# Patient Record
Sex: Female | Born: 1985 | Race: White | Hispanic: No | Marital: Married | Smoking: Current every day smoker
Health system: Southern US, Community
[De-identification: ages and names within clinical notes are randomized; demographics above are authoritative.]

## PROBLEM LIST (undated history)

## (undated) ENCOUNTER — Inpatient Hospital Stay (HOSPITAL_COMMUNITY): Payer: Self-pay

## (undated) DIAGNOSIS — F319 Bipolar disorder, unspecified: Secondary | ICD-10-CM

## (undated) DIAGNOSIS — F329 Major depressive disorder, single episode, unspecified: Secondary | ICD-10-CM

## (undated) DIAGNOSIS — F32A Depression, unspecified: Secondary | ICD-10-CM

## (undated) DIAGNOSIS — Z789 Other specified health status: Secondary | ICD-10-CM

## (undated) DIAGNOSIS — F419 Anxiety disorder, unspecified: Secondary | ICD-10-CM

## (undated) HISTORY — DX: Bipolar disorder, unspecified: F31.9

## (undated) HISTORY — PX: WISDOM TOOTH EXTRACTION: SHX21

---

## 2012-07-29 ENCOUNTER — Emergency Department (HOSPITAL_COMMUNITY): Payer: No Typology Code available for payment source

## 2012-07-29 ENCOUNTER — Encounter (HOSPITAL_COMMUNITY): Payer: Self-pay | Admitting: Emergency Medicine

## 2012-07-29 ENCOUNTER — Emergency Department (HOSPITAL_COMMUNITY)
Admission: EM | Admit: 2012-07-29 | Discharge: 2012-07-29 | Disposition: A | Discharge status: Home / Self Care | Payer: No Typology Code available for payment source | Attending: Emergency Medicine | Admitting: Emergency Medicine

## 2012-07-29 DIAGNOSIS — S139XXA Sprain of joints and ligaments of unspecified parts of neck, initial encounter: Secondary | ICD-10-CM | POA: Insufficient documentation

## 2012-07-29 DIAGNOSIS — F17200 Nicotine dependence, unspecified, uncomplicated: Secondary | ICD-10-CM | POA: Insufficient documentation

## 2012-07-29 DIAGNOSIS — Y92410 Unspecified street and highway as the place of occurrence of the external cause: Secondary | ICD-10-CM | POA: Insufficient documentation

## 2012-07-29 LAB — URINALYSIS, ROUTINE W REFLEX MICROSCOPIC
Bilirubin Urine: NEGATIVE
Ketones, ur: NEGATIVE mg/dL
Nitrite: NEGATIVE
Protein, ur: NEGATIVE mg/dL
Urobilinogen, UA: 0.2 mg/dL (ref 0.0–1.0)

## 2012-07-29 LAB — URINE MICROSCOPIC-ADD ON

## 2012-07-29 MED ORDER — IBUPROFEN 800 MG PO TABS
800.0000 mg | ORAL_TABLET | Freq: Once | ORAL | Status: AC
  Administered 2012-07-29: 800 mg via ORAL
  Filled 2012-07-29: qty 1

## 2012-07-29 MED ORDER — CYCLOBENZAPRINE HCL 10 MG PO TABS: 10.0000 mg | ORAL_TABLET | Freq: Two times a day (BID) | ORAL | Status: AC | PRN

## 2012-07-29 MED ORDER — CYCLOBENZAPRINE HCL 10 MG PO TABS
10.0000 mg | ORAL_TABLET | Freq: Once | ORAL | Status: AC
  Administered 2012-07-29: 10 mg via ORAL
  Filled 2012-07-29: qty 1

## 2012-07-29 MED ORDER — IBUPROFEN 800 MG PO TABS: 800.0000 mg | ORAL_TABLET | Freq: Three times a day (TID) | ORAL | Status: AC
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
## 2012-07-30 NOTE — ED Provider Notes (Signed)
Medical screening examination/treatment/procedure(s) were performed by non-physician practitioner and as supervising physician I was immediately available for consultation/collaboration.  Jahmiyah Dullea, MD 07/30/12 1520 

## 2012-08-05 ENCOUNTER — Other Ambulatory Visit (HOSPITAL_COMMUNITY): Payer: Self-pay | Admitting: Chiropractic Medicine

## 2012-08-05 ENCOUNTER — Ambulatory Visit (HOSPITAL_COMMUNITY)
Admission: RE | Admit: 2012-08-05 | Discharge: 2012-08-05 | Disposition: A | Discharge status: Home / Self Care | Payer: No Typology Code available for payment source | Source: Ambulatory Visit | Attending: Chiropractic Medicine | Admitting: Chiropractic Medicine

## 2012-08-05 DIAGNOSIS — M545 Low back pain, unspecified: Secondary | ICD-10-CM | POA: Insufficient documentation

## 2012-08-05 DIAGNOSIS — M549 Dorsalgia, unspecified: Secondary | ICD-10-CM

## 2012-08-05 DIAGNOSIS — Q7649 Other congenital malformations of spine, not associated with scoliosis: Secondary | ICD-10-CM | POA: Insufficient documentation

## 2013-12-11 NOTE — L&D Delivery Note (Signed)
Delivery Note At 4:53 PM a viable and healthy female was delivered via Vaginal, Spontaneous Delivery (Presentation: OA;  ).  APGAR: 8 ;8;  weight pending Placenta status: Intact, Spontaneous.  Cord: 3 vessels with the following complications: None.  Cord pH: not sent   Anesthesia: Epidural  Episiotomy: None Lacerations: 2nd degree;Perineal Suture Repair: 3.0 vicryl rapide Est. Blood Loss (mL): 300 cc  Mom to postpartum.  Baby to nursery for observation due to retracting and nasal flaring but stable.   Maressa Apollo R 07/09/2014, 5:29 PM

## 2014-04-22 ENCOUNTER — Inpatient Hospital Stay (HOSPITAL_COMMUNITY)
Admission: AD | Admit: 2014-04-22 | Discharge: 2014-04-25 | DRG: 782 | Disposition: A | Discharge status: Home / Self Care | Payer: BC Managed Care – PPO | Source: Ambulatory Visit | Attending: Obstetrics and Gynecology | Admitting: Obstetrics and Gynecology

## 2014-04-22 ENCOUNTER — Encounter (HOSPITAL_COMMUNITY): Payer: Self-pay

## 2014-04-22 ENCOUNTER — Inpatient Hospital Stay (HOSPITAL_COMMUNITY): Payer: BC Managed Care – PPO

## 2014-04-22 DIAGNOSIS — Z87891 Personal history of nicotine dependence: Secondary | ICD-10-CM

## 2014-04-22 DIAGNOSIS — O4410 Placenta previa with hemorrhage, unspecified trimester: Principal | ICD-10-CM | POA: Diagnosis present

## 2014-04-22 DIAGNOSIS — O4693 Antepartum hemorrhage, unspecified, third trimester: Secondary | ICD-10-CM | POA: Diagnosis present

## 2014-04-22 HISTORY — DX: Other specified health status: Z78.9

## 2014-04-22 LAB — CBC
HCT: 34.2 % — ABNORMAL LOW (ref 36.0–46.0)
Hemoglobin: 11.9 g/dL — ABNORMAL LOW (ref 12.0–15.0)
MCH: 30.6 pg (ref 26.0–34.0)
MCHC: 34.8 g/dL (ref 30.0–36.0)
MCV: 87.9 fL (ref 78.0–100.0)
Platelets: 256 10*3/uL (ref 150–400)
RBC: 3.89 MIL/uL (ref 3.87–5.11)
RDW: 13.5 % (ref 11.5–15.5)
WBC: 9.7 10*3/uL (ref 4.0–10.5)

## 2014-04-22 LAB — TYPE AND SCREEN
ABO/RH(D): A POS
Antibody Screen: NEGATIVE

## 2014-04-22 MED ORDER — SODIUM CHLORIDE 0.9 % IJ SOLN
3.0000 mL | INTRAMUSCULAR | Status: DC | PRN
  Administered 2014-04-25: 3 mL via INTRAVENOUS

## 2014-04-22 MED ORDER — CALCIUM CARBONATE ANTACID 500 MG PO CHEW
2.0000 | CHEWABLE_TABLET | ORAL | Status: DC | PRN
  Filled 2014-04-22: qty 2

## 2014-04-22 MED ORDER — SODIUM CHLORIDE 0.9 % IJ SOLN
3.0000 mL | Freq: Two times a day (BID) | INTRAMUSCULAR | Status: DC
  Administered 2014-04-23 – 2014-04-24 (×5): 3 mL via INTRAVENOUS

## 2014-04-22 MED ORDER — PRENATAL MULTIVITAMIN CH
1.0000 | ORAL_TABLET | Freq: Every day | ORAL | Status: DC
  Administered 2014-04-23 – 2014-04-25 (×3): 1 via ORAL
  Filled 2014-04-22 (×3): qty 1

## 2014-04-22 MED ORDER — ACETAMINOPHEN 325 MG PO TABS: 650.0000 mg | ORAL_TABLET | ORAL | Status: DC | PRN

## 2014-04-22 MED ORDER — DOCUSATE SODIUM 100 MG PO CAPS
100.0000 mg | ORAL_CAPSULE | Freq: Every day | ORAL | Status: DC
  Filled 2014-04-22 (×3): qty 1

## 2014-04-22 MED ORDER — ZOLPIDEM TARTRATE 5 MG PO TABS: 5.0000 mg | ORAL_TABLET | Freq: Every evening | ORAL | Status: DC | PRN

## 2014-04-22 MED ORDER — SODIUM CHLORIDE 0.9 % IV SOLN: 250.0000 mL | INTRAVENOUS | Status: DC | PRN

## 2014-04-22 MED ORDER — BETAMETHASONE SOD PHOS & ACET 6 (3-3) MG/ML IJ SUSP
12.0000 mg | INTRAMUSCULAR | Status: AC
  Administered 2014-04-22 – 2014-04-23 (×2): 12 mg via INTRAMUSCULAR
  Filled 2014-04-22 (×2): qty 2

## 2014-04-22 MED ORDER — FOLIC ACID 1 MG PO TABS
1.0000 mg | ORAL_TABLET | Freq: Every day | ORAL | Status: DC
  Administered 2014-04-23 – 2014-04-25 (×3): 1 mg via ORAL
  Filled 2014-04-22 (×4): qty 1

## 2014-04-22 MED ORDER — FOLIC ACID 800 MCG PO TABS: 800.0000 ug | ORAL_TABLET | Freq: Every day | ORAL | Status: DC

## 2014-04-22 NOTE — Progress Notes (Signed)
Notified of pt arrival in MAU and complaint. Will come see pt 

## 2014-04-22 NOTE — MAU Note (Signed)
Called antenatal to give report to charge RN. Charge RN in room and will call back

## 2014-04-22 NOTE — MAU Note (Addendum)
Pt presents of heavy bright red bleeding that started around 1920. Denies vaginal discharge. Reports good fetal movement. Pt has known complete previa per prenatal record

## 2014-04-22 NOTE — MAU Note (Signed)
Report given to charge RN on antenatal. Will go to room 156

## 2014-04-22 NOTE — MAU Provider Note (Signed)
  History   CSN: [id number]  Arrival date and time: 04/22/14 1944 Nurse call to provider @ 2011 Provider here to evaluate patient @ 2018   Chief Complaint  Patient presents with  . Vaginal Bleeding    known complete previa   [facility] Ob patient of Dr Ernestina PennaFogleman Onset bright red bleeding this evening after getting home from work Last intercourse was Sunday - no bleeding or spotting Hx previa @ [facility] - follow-up sono scheduled for May 26th  Medical History reviewed: oligiomenorrhea Surgical history reviewed: none Family HX reviewed: sister's first child-anencephalic  Prenatal records reviewed: Blood type/ RH: A+ Complete previa without any previous bleeding episode (+) carpal tunnel of pregnancy  History  Substance Use Topics  . Smoking status: Never Smoker   . Smokeless tobacco: Not on file  . Alcohol Use: No   Allergies:  Allergies  Allergen Reactions  . Latex Itching   Current medications: Prenatal vitamin daily  ROS active FM no ctx or cramping + red bleeding since 1900  Physical Exam   Blood pressure 116/84, pulse 92, temperature 97.7 F (36.5 C), temperature source Oral, resp. rate 18.  Physical Exam Alert and oriented x 3 NAD or pain Abdomen soft and non-tender / uterus gravid and non-tender Spec exam: small dark red blood in vaginal vault                      cervix appears long and closed - no active bleeding seen  FHR baseline 150 / moderate variability / + accels to 180 / no decels TOCO - no ctx or UI  MAU Course  Procedures  NST - reactive  Ultrasound -    Assessment and Plan  26.4 weeks primigravida / Rh (+) Bright red vaginal bleeding episode  Hx placenta previa FHR category 1 without evidence of ctx  1) ultrasound  2) NST and toco 3) consult MD for admit after sono  Marlinda Mikeanya Adric Wrede CNM MSN [facility] 04/22/2014, 8:24 PM   ADDENDUM NOTE 2155pm - CONSULT with Dr Billy Coastaavon - updated with status and sono report  Plan:    admit 24-48 hours s/p third trimester bleeding episode likely marginal bleed BMZ course EFM toco for uterine activity / intermittent FHR assessment  Marlinda Mikeanya Takera Rayl CNM MSN [facility]

## 2014-04-22 NOTE — H&P (Signed)
  OB ADMISSION/ HISTORY & PHYSICAL:  Admission Date: 04/22/2014  7:45 PM  Admit Diagnosis: 26.4 weeks with third trimester bleeding - presumed marginal placental bleed. Previous history of placenta previa.  Anne Rodriguez is a 28 y.o. female presenting for bright red bleeding episode this evening - hx previa.  Prenatal History: G1P0   EDC : 07/25/2014, Alternate EDD Entry  [facility]  Primary Ob Provider: Dr Ernestina PennaFogleman Prenatal course complicated by previa  Prenatal Labs: ABO, Rh:   A positive  Antibody:  negative Rubella:   Immune RPR:   NR HBsAg:   negative HIV:   NR GTT: not done yet GBS:   not done yet  Medical / Surgical History :  Past medical history:oligiomenorrhea  Past surgical history: History reviewed. No pertinent past surgical history.  Family History: sister's child anecephaly  Social History:  reports that she has never smoked. She does not have any smokeless tobacco history on file. She reports that she does not drink alcohol or use illicit drugs.  Allergies: Latex   Current Medications at time of admission:  Prior to Admission medications   Medication Sig Start Date End Date Taking? Authorizing Provider  Ca Carbonate-Mag Hydroxide (ROLAIDS PO) Take 4 tablets by mouth daily.   Yes Historical Provider, MD  folic acid (FOLVITE) 800 MCG tablet Take 800 mcg by mouth daily.   Yes Historical Provider, MD  Prenatal Vit-Fe Fumarate-FA (PRENATAL MULTIVITAMIN) TABS tablet Take 1 tablet by mouth daily at 12 noon.   Yes Historical Provider, MD   Physical Exam:  SONO per MFM - low lying placenta - no previa / cervical length 3.5  VS: Blood pressure 116/84, pulse 92, temperature 97.7 F (36.5 C), temperature source Oral, resp. rate 18.  General: alert and oriented, appears calm and NAD or pain Heart: RRR Lungs: Clear lung fields Abdomen: Gravid, soft and non-tender, non-distended / uterus: gravid and non-tender Extremities: no  edema  Genitalia / VE:  DEFERRED  (cervical length per sono 3.5cm) / appears closed/long on visual exam  FHR: baseline rate 150 / variability moderate / accelerations + / no decelerations TOCO: no ctx or UI  Assessment: 26.[redacted] weeks gestation Third trimester bleeding - marginal placental bleed. Low lying post placenta on sono. FHR category 1 No evidence of PTL or persistent hemorrhage necessitating delivery at this time  Plan:  Admit for 24-48 hours to monitor bleeding resolution BMZ course TOCO to monitor for CTX  Discuss with patient and spouse plan of care & rationale for inpt management OOW until next ROB NO sex until placenta no longer low lying  Marlinda Mikeanya Bailey CNM, MSN, [facility] 04/22/2014, 9:59 PM

## 2014-04-23 LAB — GLUCOSE, CAPILLARY: Glucose-Capillary: 116 mg/dL — ABNORMAL HIGH (ref 70–99)

## 2014-04-23 LAB — ABO/RH: ABO/RH(D): A POS

## 2014-04-23 NOTE — Progress Notes (Signed)
Patient ID: Peggye LeyMarcia Rodriguez, female   DOB: [birth year], 28 y.o.   MRN: [id number] HD #1 26 5/7 weeks Low lying placenta with bleeding  S: Good FM Spotting today. No further heavy bleeding. No LOF. No CP or SOB. O: BP 118/60  Pulse 86  Temp(Src) 97.8 F (36.6 C) (Oral)  Resp 18  Ht 5\' 1"  (1.549 m)  Wt 80.74 kg (178 lb)  BMI 33.65 kg/m2  Lgs: CTA CV: RRR Abd: soft , gravid , non tender No CVAT Pelvic : deferred Ext: Neg c/c/e Neuro: non focal Skin: intact  FHR reactive. No contractions  CBC    Component Value Date/Time   WBC 9.7 04/22/2014 2235   RBC 3.89 04/22/2014 2235   HGB 11.9* 04/22/2014 2235   HCT 34.2* 04/22/2014 2235   PLT 256 04/22/2014 2235   MCV 87.9 04/22/2014 2235   MCH 30.6 04/22/2014 2235   MCHC 34.8 04/22/2014 2235   RDW 13.5 04/22/2014 2235    IMP: 26 5/7 weeks IUP Posterior Low lying placenta with acute bleeding episode Reformed Smoker  P: BMZ #1 done. Repeat today. Type and Screen NST q shift

## 2014-04-24 NOTE — Progress Notes (Addendum)
HD #2 26 6/7 weeks  Low lying placenta with bleeding  S:  Good FM  Spotting today. No further heavy bleeding.  No LOF. No CP or SOB.  O:  BP 115/54  Pulse 106   Temp(Src) 98.3 F (Oral)  Resp 18  Ht 5\' 1"  (1.549 m)  Wt 80.74 kg (178 lb)  BMI 33.65 kg/m2  Lgs: CTA  CV: RRR  Abd: soft , gravid , non tender  No CVAT  Pelvic : deferred  Ext: Neg c/c/e  Neuro: non focal  Skin: intact  FHR reactive.  No contractions  CBC    Component  Value  Date/Time    WBC  9.7  04/22/2014 2235    RBC  3.89  04/22/2014 2235    HGB  11.9*  04/22/2014 2235    HCT  34.2*  04/22/2014 2235    PLT  256  04/22/2014 2235    MCV  87.9  04/22/2014 2235    MCH  30.6  04/22/2014 2235    MCHC  34.8  04/22/2014 2235    RDW  13.5  04/22/2014 2235    IMP:  26 6/7 weeks IUP.  FHR monitoring reactive. Posterior Low lying placenta with acute bleeding episode  Reformed Smoker  P:  BMZ x2 completed.  Will keep in hospital x at least 24 hrs without vaginal bleeding. Type and Screen  NST q shift  Genia DelMarie-Lyne Iyana Topor MD  04/24/14 at 11:01

## 2014-04-24 NOTE — MAU Provider Note (Signed)
Reviewed and agree with note and plan. V.Dyanara Cozza, MD  

## 2014-04-25 NOTE — Progress Notes (Signed)
HD #3 27 weeks  Low lying placenta with bleeding  S:  Good FM  Brown Spotting today. No further red bleeding, none for almost 48 hrs  No LOF. No CP or SOB.  No ctx O:  BP 115/54  Pulse 106   Temp(Src) 98.3 F (Oral)  Resp 18  Ht 5\' 1"  (1.549 m)  Wt 80.74 kg (178 lb)  BMI 33.65 kg/m2   Abd: soft , gravid , non tender  No CVAT  Pelvic : scant brown d/c  Ext: Neg c/c/e  Neuro: non focal  Skin: intact   NST pending   CBC    Component  Value  Date/Time    WBC  9.7  04/22/2014 2235    RBC  3.89  04/22/2014 2235    HGB  11.9*  04/22/2014 2235    HCT  34.2*  04/22/2014 2235    PLT  256  04/22/2014 2235    MCV  87.9  04/22/2014 2235    MCH  30.6  04/22/2014 2235    MCHC  34.8  04/22/2014 2235    RDW  13.5  04/22/2014 2235    IMP:  27 weeks IUP.   Posterior Low lying placenta with acute bleeding episode, now resolved Reformed Smoker  P:  BMZ x2 completed.   D/c to home on bedrest w/ f/u in 1.5 wks for repeat u/s Return immediately w/ any bleeding  Wilmon Conover A. Pauline Pegues 04/25/2014 11:13 AM

## 2014-04-25 NOTE — Discharge Summary (Signed)
Patient ID: Anne Rodriguez MRN: [id number] DOB/AGE: 1986/10/29 27 y.o.  Admit date: 04/22/2014 Discharge date: 04/25/14  Admission Diagnoses: 2nd trimester bleeding, low lying placenta Discharge Diagnoses:  2nd trimester bleeding, low lying placenta        Discharged Condition: stable  Hospital Course: admitted with bright red vaginal bleeding. Cervix long/ closed, uterus non-tender, reactive NST, no evidence labor.  NST/ toco continued. BMZ received, bed rest. U/s done to show low lying placenta. Pt remained stable on bed rest. HD#2 additional single episode of bleeding. Reactive maternal and fetal status, mom hemodynamically stable. D/c to home on HD#4 with instructions for bed rest at home.   Consults: None and MFM  Treatments: IV hydration, steroids: BMZ  Disposition: home     Medication List    STOP taking these medications       ROLAIDS PO      TAKE these medications       folic acid 800 MCG tablet  Commonly known as:  FOLVITE  Take 800 mcg by mouth daily.     prenatal multivitamin Tabs tablet  Take 1 tablet by mouth daily at 12 noon.         Signed: Alphonsus SiasKelly A. Ernestina PennaFogleman, MD MD 04/25/2014, 11:15 AM

## 2014-04-29 ENCOUNTER — Inpatient Hospital Stay (HOSPITAL_COMMUNITY): Payer: BC Managed Care – PPO

## 2014-04-29 ENCOUNTER — Inpatient Hospital Stay (HOSPITAL_COMMUNITY)
Admission: AD | Admit: 2014-04-29 | Discharge: 2014-04-29 | Disposition: A | Discharge status: Home / Self Care | Payer: BC Managed Care – PPO | Source: Ambulatory Visit | Attending: Obstetrics & Gynecology | Admitting: Obstetrics & Gynecology

## 2014-04-29 ENCOUNTER — Encounter (HOSPITAL_COMMUNITY): Payer: Self-pay

## 2014-04-29 DIAGNOSIS — R109 Unspecified abdominal pain: Secondary | ICD-10-CM | POA: Insufficient documentation

## 2014-04-29 DIAGNOSIS — O4440 Low lying placenta NOS or without hemorrhage, unspecified trimester: Secondary | ICD-10-CM

## 2014-04-29 DIAGNOSIS — O4410 Placenta previa with hemorrhage, unspecified trimester: Secondary | ICD-10-CM | POA: Insufficient documentation

## 2014-04-29 DIAGNOSIS — Z87891 Personal history of nicotine dependence: Secondary | ICD-10-CM | POA: Insufficient documentation

## 2014-04-29 DIAGNOSIS — O4693 Antepartum hemorrhage, unspecified, third trimester: Secondary | ICD-10-CM

## 2014-04-29 LAB — URINE MICROSCOPIC-ADD ON

## 2014-04-29 LAB — URINALYSIS, ROUTINE W REFLEX MICROSCOPIC
Bilirubin Urine: NEGATIVE
Glucose, UA: NEGATIVE mg/dL
Ketones, ur: NEGATIVE mg/dL
Leukocytes, UA: NEGATIVE
Nitrite: NEGATIVE
Protein, ur: NEGATIVE mg/dL
SPECIFIC GRAVITY, URINE: 1.01 (ref 1.005–1.030)
Urobilinogen, UA: 1 mg/dL (ref 0.0–1.0)
pH: 6 (ref 5.0–8.0)

## 2014-04-29 NOTE — Discharge Instructions (Signed)
Vaginal Bleeding During Pregnancy, Third Trimester A small amount of bleeding (spotting) from the vagina is relatively common in pregnancy. Various things can cause bleeding or spotting in pregnancy. Sometimes the bleeding is normal and is not a problem. However, bleeding during the third trimester can also be a sign of something serious for the mother and the baby. Be sure to tell your health care provider about any vaginal bleeding right away.  Some possible causes of vaginal bleeding during the third trimester include:   The placenta may be partially or completely covering the opening to the cervix (placenta previa).   The placenta may have separated from the uterus (abruption of the placenta).   There may be an infection or growth on the cervix.   You may be starting labor, called discharging of the mucus plug.   The placenta may grow into the muscle layer of the uterus (placenta accreta).  HOME CARE INSTRUCTIONS  Watch your condition for any changes. The following actions may help to lessen any discomfort you are feeling:   Follow your health care provider's instructions for limiting your activity. If your health care provider orders bed rest, you may need to stay in bed and only get up to use the bathroom. However, your health care provider may allow you to continue light activity.  If needed, make plans for someone to help with your regular activities and responsibilities while you are on bed rest.  Keep track of the number of pads you use each day, how often you change pads, and how soaked (saturated) they are. Write this down.  Do not use tampons. Do not douche.  Do not have sexual intercourse or orgasms until approved by your health care provider.  Follow your health care provider's advice about lifting, driving, and physical activities.  If you pass any tissue from your vagina, save the tissue so you can show it to your health care provider.   Only take over-the-counter  or prescription medicines as directed by your health care provider.  Do not take aspirin because it can make you bleed.   Keep all follow-up appointments as directed by your health care provider. SEEK MEDICAL CARE IF:  You have any vaginal bleeding during any part of your pregnancy.  You have cramps or labor pains. SEEK IMMEDIATE MEDICAL CARE IF:   You have severe cramps or pain in your back or belly (abdomen).  You have a fever, not controlled by medicine.  You have chills.  You have a gush of fluid from the vagina.  You pass large clots or tissue from your vagina.  Your bleeding increases.  You feel lightheaded or weak.  You pass out.  You feel less movement or no movement of the baby.  MAKE SURE YOU:  Understand these instructions.  Will watch your condition.  Will get help right away if you are not doing well or get worse. Document Released: 02/17/2003 Document Revised: 09/17/2013 Document Reviewed: 08/04/2013 Exit[employer] Patient Information 2014 [employer].  

## 2014-04-29 NOTE — MAU Provider Note (Signed)
History     CSN: [id number]  Arrival date and time: 04/29/14 0404 RN notified provider of pt arrival 0426 First Provider Initiated Contact with Patient 04/29/14 [phone]      Chief Complaint  Patient presents with  . Vaginal Bleeding   HPI Comments: G1 @27 .4 wks with acute episode of BRB @0300  this am. Soaked through pad and clothes, passed dime sized clot x2. Good FM. No LOF. Mild cramping lower left. Pregnancy previously complicated by previa, most recently posterior and low-lying, 1.2 cm from os with hospital admission on 04/22/14 for episode of bleeding. S/p BMZ x2. On BR at home since discharge. Nothing PV. Blood type A pos.  Vaginal Bleeding The patient's primary symptoms include vaginal bleeding. This is a recurrent problem. The current episode started today.    OB History   Grav Para Term Preterm Abortions TAB SAB Ect Mult Living   1 0 0 0 0 0 0 0 0 0       Past Medical History  Diagnosis Date  . Medical history non-contributory     Past Surgical History  Procedure Laterality Date  . Wisdom tooth extraction      Family History  Problem Relation Age of Onset  . Cancer Mother   . Heart disease Father     History  Substance Use Topics  . Smoking status: Former Smoker -- 0.50 packs/day for 6 years    Types: Cigarettes    Quit date: 11/10/2012  . Smokeless tobacco: Not on file  . Alcohol Use: No    Allergies:  Allergies  Allergen Reactions  . Latex Itching    Prescriptions prior to admission  Medication Sig Dispense Refill  . folic acid (FOLVITE) 800 MCG tablet Take 800 mcg by mouth daily.      . Prenatal Vit-Fe Fumarate-FA (PRENATAL MULTIVITAMIN) TABS tablet Take 1 tablet by mouth daily at 12 noon.        Review of Systems  Constitutional: Negative.   HENT: Negative.   Eyes: Negative.   Respiratory: Negative.   Cardiovascular: Negative.   Gastrointestinal: Negative.   Genitourinary: Positive for vaginal bleeding.       +VB +cramping LLQ   Musculoskeletal: Negative.   Skin: Negative.   Neurological: Negative.   Endo/Heme/Allergies: Negative.   Psychiatric/Behavioral: Negative.    Physical Exam   Blood pressure 123/75, pulse 99, temperature 97.8 F (36.6 C), temperature source Oral, resp. rate 18, height 5\' 1"  (1.549 m), weight 79.379 kg (175 lb), SpO2 99.00%.  Physical Exam  Constitutional: She is oriented to person, place, and time. She appears well-developed and well-nourished.  HENT:  Head: Normocephalic.  Neck: Normal range of motion.  Cardiovascular: Normal rate and regular rhythm.   Respiratory: Effort normal and breath sounds normal.  GI: Soft. Bowel sounds are normal.  gravid  Genitourinary: Vagina normal.  Speculum: small dark red blood in vault, no active bleeding from os, small clot x1, visually closed  Musculoskeletal: Normal range of motion.  Neurological: She is alert and oriented to person, place, and time.  Skin: Skin is warm and dry.  Psychiatric: She has a normal mood and affect.  EFM: 140 bpm, moderate variability, +accels, no decels Toco: none  MAU Course  Procedures  Sono (preliminary) No placental abruption. Portions of posterior placenta are obscured by fetal parts. Placenta posterior, above os. Inferior placental edge is 1.52 cm from internal os. AFV: subjectively normal. Vertex presentation. CL 4.4 cm.   Assessment and Plan  27.[redacted] weeks gestation Posterior  low-lying placenta with acute bleeding episode, no evidence of abruption Fetal tracing Cat I  Discharge home BR and pelvic rest Bleeding precautions Follow-up this week in office with Dr. Ernestina PennaFogleman  Discussed assessment and plan with Dr. Dairl PonderLavoie  Rome Schlauch N Sergio Zawislak 04/29/2014, 5:49 AM

## 2014-04-29 NOTE — MAU Note (Signed)
Pt reports she had another episode of vaginal bleeding this am, mild cramping

## 2014-05-26 ENCOUNTER — Inpatient Hospital Stay (HOSPITAL_COMMUNITY)
Admission: AD | Admit: 2014-05-26 | Discharge: 2014-05-31 | DRG: 782 | Disposition: A | Discharge status: Home / Self Care | Payer: BC Managed Care – PPO | Source: Ambulatory Visit | Attending: Obstetrics | Admitting: Obstetrics

## 2014-05-26 ENCOUNTER — Encounter (HOSPITAL_COMMUNITY): Payer: Self-pay | Admitting: *Deleted

## 2014-05-26 DIAGNOSIS — O4590 Premature separation of placenta, unspecified, unspecified trimester: Principal | ICD-10-CM | POA: Diagnosis present

## 2014-05-26 DIAGNOSIS — O4700 False labor before 37 completed weeks of gestation, unspecified trimester: Secondary | ICD-10-CM | POA: Diagnosis present

## 2014-05-26 DIAGNOSIS — O4593 Premature separation of placenta, unspecified, third trimester: Secondary | ICD-10-CM | POA: Diagnosis present

## 2014-05-26 DIAGNOSIS — Z87891 Personal history of nicotine dependence: Secondary | ICD-10-CM

## 2014-05-26 LAB — TYPE AND SCREEN
ABO/RH(D): A POS
Antibody Screen: NEGATIVE

## 2014-05-26 LAB — CBC
HCT: 36.6 % (ref 36.0–46.0)
Hemoglobin: 12.5 g/dL (ref 12.0–15.0)
MCH: 30.5 pg (ref 26.0–34.0)
MCHC: 34.2 g/dL (ref 30.0–36.0)
MCV: 89.3 fL (ref 78.0–100.0)
PLATELETS: 247 10*3/uL (ref 150–400)
RBC: 4.1 MIL/uL (ref 3.87–5.11)
RDW: 14 % (ref 11.5–15.5)
WBC: 10.1 10*3/uL (ref 4.0–10.5)

## 2014-05-26 MED ORDER — PRENATAL MULTIVITAMIN CH
1.0000 | ORAL_TABLET | Freq: Every day | ORAL | Status: DC
  Administered 2014-05-27 – 2014-05-31 (×5): 1 via ORAL
  Filled 2014-05-26 (×5): qty 1

## 2014-05-26 MED ORDER — SODIUM CHLORIDE 0.9 % IJ SOLN: 3.0000 mL | INTRAMUSCULAR | Status: DC | PRN

## 2014-05-26 MED ORDER — SODIUM CHLORIDE 0.9 % IJ SOLN
3.0000 mL | Freq: Two times a day (BID) | INTRAMUSCULAR | Status: DC
  Administered 2014-05-26 – 2014-05-29 (×7): 3 mL via INTRAVENOUS

## 2014-05-26 MED ORDER — ZOLPIDEM TARTRATE 5 MG PO TABS: 5.0000 mg | ORAL_TABLET | Freq: Every evening | ORAL | Status: DC | PRN

## 2014-05-26 MED ORDER — DOCUSATE SODIUM 100 MG PO CAPS
100.0000 mg | ORAL_CAPSULE | Freq: Every day | ORAL | Status: DC
  Administered 2014-05-27 – 2014-05-31 (×4): 100 mg via ORAL
  Filled 2014-05-26 (×5): qty 1

## 2014-05-26 MED ORDER — BETAMETHASONE SOD PHOS & ACET 6 (3-3) MG/ML IJ SUSP
12.0000 mg | INTRAMUSCULAR | Status: AC
  Administered 2014-05-26 – 2014-05-27 (×2): 12 mg via INTRAMUSCULAR
  Filled 2014-05-26 (×2): qty 2

## 2014-05-26 MED ORDER — LACTATED RINGERS IV BOLUS (SEPSIS)
1000.0000 mL | Freq: Once | INTRAVENOUS | Status: AC
  Administered 2014-05-26: 1000 mL via INTRAVENOUS

## 2014-05-26 MED ORDER — PANTOPRAZOLE SODIUM 40 MG PO TBEC
40.0000 mg | DELAYED_RELEASE_TABLET | Freq: Every day | ORAL | Status: DC
  Administered 2014-05-27 – 2014-05-31 (×5): 40 mg via ORAL
  Filled 2014-05-26 (×5): qty 1

## 2014-05-26 MED ORDER — CALCIUM CARBONATE ANTACID 500 MG PO CHEW: 2.0000 | CHEWABLE_TABLET | ORAL | Status: DC | PRN

## 2014-05-26 MED ORDER — ACETAMINOPHEN 325 MG PO TABS: 650.0000 mg | ORAL_TABLET | ORAL | Status: DC | PRN

## 2014-05-26 MED ORDER — SODIUM CHLORIDE 0.9 % IV SOLN: 250.0000 mL | INTRAVENOUS | Status: DC | PRN

## 2014-05-26 NOTE — H&P (Signed)
Anne Rodriguez is a 28 y.o. G1P0000 at 4189w3d presenting for evaluation of 3rd trimester bleeding c/w chronic abruption. Pt notes mild abdominal pressure starting last night along with light brown spotting. This am, still mild pressure and onset of bright red vaginal bleeding. No contractions, no leakage of fluid, no pain, no fevers.  Pt diagnosed with complete previa at 20 wks. Pt had an episode of bleeding at 26 wks, was admitted for 3 days for BMZ and evaluation. Bleeding was never heavy and eventually stopped. U/s at 26 wks with placenta 1.2 cm from the os. Since that time, pt has had an MAU eval for bright red bleeding and an office eval for dark red bleeding. At that last bleeding episode recommendation was made for inpt admission but pt declined and instead decided to stay on home bed rest. Pt has been continuing on bed rest at home, with the exception of attending her baby shower on Sunday.  Bleeding this am, stopped by office visit later this am.   [facility] 8 wks - placenta previa and 20 wks, now posterior placenta by u/s at 31 wks - 2nd trimester bleeding, now 3rd trimester bleeding, c/w chronic abruption - normal fetal growth- 6/15: 4'1, 80%, nl AFI, vtx, posterior placenta, AFI 16 - unplanned but desired pregnancy - dated by 8 wk u/s   Prenatal Transfer Tool  Maternal Diabetes: No Genetic Screening: Normal Maternal Ultrasounds/Referrals: Normal Fetal Ultrasounds or other Referrals:  None Maternal Substance Abuse:  No Significant Maternal Medications:  None Significant Maternal Lab Results: None     OB History   Grav Para Term Preterm Abortions TAB SAB Ect Mult Living   1 0 0 0 0 0 0 0 0 0      Past Medical History  Diagnosis Date  . Medical history non-contributory    Past Surgical History  Procedure Laterality Date  . Wisdom tooth extraction     Family History: family history includes Alcohol abuse in her father and mother; Cancer in her mother;  Cerebral palsy in her maternal aunt; Heart disease in her father; Miscarriages / Stillbirths in her maternal grandmother. Social History:  reports that she quit smoking about 18 months ago. Her smoking use included Cigarettes. She has a 3 pack-year smoking history. She does not have any smokeless tobacco history on file. She reports that she does not drink alcohol or use illicit drugs.  Review of Systems - Negative except bleeding, none now     Blood pressure 110/68, pulse 91, temperature 98.9 F (37.2 C), temperature source Oral, resp. rate 20, height 5\' 1"  (1.549 m), weight 81.829 kg (180 lb 6.4 oz).  Physical Exam:  Gen: well appearing, no distress CV: RRR Pulm: CTAB Back: no CVAT Abd: gravid, NT, no RUQ pain LE: no edema, equal bilaterally, non-tender Toco: irritibility notes FH: baseline 140s, accelerations present, no deceleratons, 10 beat variability GU: cvx long, closed by SSE no digital exam done,  dark red clot at cvx,, 1 Fox Swab worth of blood in vagina, no active bleeding  Prenatal labs: ABO, Rh: --/--/A POS (06/16 1450) Antibody: PENDING (06/16 1450) Rubella:  immune RPR:   NR HBsAg:   neg HIV:   neg GBS:   pending 1 hr Glucola 113  Genetic screening nl NT, nl AFP Anatomy US nl u/s   Assessment/Plan: 28 y.o. G1P0000 at 4889w3d 4th episode bleeding. C/w chronic abruption. recc inpt admission for 1 wk to eval for continued bleeding. Given pt's closeness to  hospital and known compliance with eval for bleeding may be able to d/c home prior to delivery if stable. For now, bedrest w/ bathroom priviledges, continuous EFM and toco. Check UCx, GBS. Keep IV access and active T/S.  Fetal well being. Reactive NST, nl growth, given BMZ given <28 wks, now >4 wks ago will repeat 1 time salvage dose.   SCDs, colace, PNV, Protonix   FOGLEMAN,KELLY A. 05/26/2014, 5:00 PM

## 2014-05-27 LAB — URINE CULTURE
COLONY COUNT: NO GROWTH
CULTURE: NO GROWTH

## 2014-05-27 MED ORDER — NIFEDIPINE 10 MG PO CAPS
20.0000 mg | ORAL_CAPSULE | Freq: Four times a day (QID) | ORAL | Status: DC
  Administered 2014-05-27 – 2014-05-31 (×17): 20 mg via ORAL
  Filled 2014-05-27 (×18): qty 2

## 2014-05-27 MED ORDER — LACTATED RINGERS IV SOLN
INTRAVENOUS | Status: AC
  Administered 2014-05-27: 09:00:00 via INTRAVENOUS

## 2014-05-27 NOTE — Progress Notes (Signed)
Pt states she did not sleep well last night d/t awareness of "trickling" blood.  On pad this a.m., noted surface of pad with mixture of bright red and dark brown bleeding; new pad applied. Denies active bleeding at this time.  Denies regular cramping or tightness. Toco belt readjusted.

## 2014-05-27 NOTE — Progress Notes (Signed)
HD# 2, 31'4 chronic abruption  S: Pt notes some LQ cramping, no contractions, good FM, no LOF. Pt notes still with some dark red blood per vagina, changing pad every few hours. Pt did have another episode bright red bleeding at 3am.  O: Filed Vitals:   05/26/14 1950 05/26/14 2100 05/26/14 2200 05/27/14 0805  BP: 118/70   100/59  Pulse: 113   111  Temp: 98 F (36.7 C)   98.3 F (36.8 C)  TempSrc: Oral   Oral  Resp: 18 18 18 20   Height:      Weight:       Gen: well appearing, no distress, lying in bed Abd: gravid, NT, no RUQ pain LE" NT, no edema GU: dark brown staining on pad. Toco: irritibitlity noted, no discreet contractions FH: 140's, + accels, 10 beat variability. At 2 am 7 min decel to 90s with good recovery. 9 am another 7 min decel to 90s (pt was lying on back)  A/P: 28 yo G1 at 31'4 with chronic abruption - Chronic abruption. Bleeding persists but fetal status remains reasurring. Given early gestational age, expectant management at this time -Fetal well being. Rescue dose steroids at this time. Will not repeat after today - Irritibility. Start Procardia. IVF bolus now.  FOGLEMAN,KELLY A. 05/27/2014 12:41 PM    Toco: irritibility noted FH: 140's, +

## 2014-05-27 NOTE — Progress Notes (Signed)
Prolonged decel to 90's with 2 uc's 3 min apart; Pt noted to be flat on back; repositioned and reinforced sidelying position for majority of time.  Emotional support and education re: plan of care, Procardia, IV hydration.  Pt expresses understanding and agreement.

## 2014-05-27 NOTE — Progress Notes (Signed)
Ur chart review completed.  

## 2014-05-28 NOTE — Consult Note (Signed)
[facility]  Prenatal Consult       05/28/2014  3:42 PM   I was asked by Dr. Ernestina PennaFogleman to consult on this patient for possible preterm delivery.  I had the pleasure of meeting with Ms. Anne Rodriguez today.  She is a 28 y/o G1P0 mother at 3331 and 5/[redacted] weeks gestation who was admitted on 6/16 for bleeding with chronic abruption.  Her pregnancy has been complicated by placental previa that has resolved and a chronic abruption that began in the 2nd trimester.  She received BMX x2 at 26 weeks and then again this admission.  At this time, the plan is to deliver at [redacted] weeks gestation or sooner depending on her clinical status.    I explained that the neonatal intensive care team would be present for the delivery and outlined the likely delivery room course for this baby including routine resuscitation and NRP-guided approaches to the treatment of respiratory distress. We discussed other common problems associated with prematurity including respiratory distress syndrome/CLD, apnea, feeding issues, temperature regulation, and infection risk.  We briefly discussed IVH/PVL, ROP, and NEC and that these are complications associated with prematurity, but that by 30 weeks are uncommon.    We discussed the average length of stay but I noted that the actual LOS would depend on the severity of problems encountered and response to treatments.  We discussed visitation policies and the resources available while her child is in the hospital.  We discussed the importance of good nutrition and various methods of providing nutrition (parenteral hyperalimentation, gavage feedings and/or oral feeding). We discussed the benefits of human milk. I encouraged breast feeding and pumping soon after birth and outlined resources that are available to support breast feeding.  She is planning to provide breast milk.   Thank you for involving us in the care of this patient. A member of our team will be available should the  family have additional questions.  Time for consultation approximately 45 minutes.   _____________________ Electronically Signed By: Anne CharLindsey Madlynn Lundeen, MD Neonatologist

## 2014-05-28 NOTE — Progress Notes (Addendum)
HD#4 31.6 wks, 3rd trimester bleeding, chronic abruption, previa resolved  S: No contractions/ pain. Only slight brown vag discharge and no active bleeding, no cramping since Procardia started.   O: BP 102/65  Pulse 101  Temp(Src) 98.1 F (36.7 C) (Oral)  Resp 18  Ht 5\' 1"  (1.549 m)  Wt 180 lb 12.8 oz (82.01 kg)  BMI 34.18 kg/m2 A&Ox3. No distress.  CVRRR Abdomen: gravid soft non tender, no palpable contractions.  Pad- Dark brown spots on pantiliner Tracing: baseline 150(+)  Accels noted (approiate for gest.age). Occasional variable decels 15 beat down and return to baseline in 1-2 minutes. Moderate variability No ctx  Lab- GBS(-).          Active T&S.  A/P:  31.6 wks.  3rd trimester vaginal bleeding, suspect chronic abruption/marginal separation, previa resolved. Sono in office 6/16- Cephalic,4+ lbs, AFI normal BMZ completed rescue dose (no further dosing) UCs/irritability resolved since Procardia started and is tolerating it well. S/p NICU consult. Change monitoring to 1 hr every 4 hrs as long as reactive and no further bleeding or contractions. Continue in-pt care.

## 2014-05-29 LAB — TYPE AND SCREEN
ABO/RH(D): A POS
Antibody Screen: NEGATIVE

## 2014-05-29 LAB — CULTURE, BETA STREP (GROUP B ONLY)

## 2014-05-29 LAB — OB RESULTS CONSOLE GBS: GBS: NEGATIVE

## 2014-05-30 NOTE — Progress Notes (Signed)
Patient ID: Anne Rodriguez, female   DOB: 10/10/1986, 28 y.o.   MRN: [id number] HD #5 32 0/7 weeks Third trimester Bleeding- ? Chronic Abruption  S: Good FM. No  LOF. No bleeding. No regular contractions. Occ brownish dc. No CP or SOB. No nausea. No vomiting.  O: Patient Vitals for the past 24 hrs:  BP Temp Temp src Pulse Resp Weight  05/30/14 0801 100/57 mmHg 98.4 F (36.9 C) Oral 112 18 -  05/30/14 0553 98/52 mmHg 98.2 F (36.8 C) Oral 101 20 -  05/30/14 0003 93/53 mmHg 98.1 F (36.7 C) Oral 90 18 -  05/29/14 1922 99/52 mmHg 98.1 F (36.7 C) Oral 96 19 -  05/29/14 1805 109/62 mmHg - - 103 18 -  05/29/14 1603 105/69 mmHg 98.3 F (36.8 C) Oral 135 18 -  05/29/14 1500 - - - - - 83.689 kg (184 lb 8 oz)  05/29/14 1208 112/65 mmHg 98.4 F (36.9 C) Oral 101 18 -   NCAT HEENT: nl Lungs: CTA CV: RRR Abd: Gravid , NT No CVAT Ext : neg C/c/e Neuro: non focal Skin : intact  Reactive NST noted. Irregular contractions.  ImP: 32 week IUP Third Trimester Bleeding- likely chronic abruptio Clinically stable. Sono 6/16 c/w AGA fetus. Preterm contractions - stable on Procardia  Plan: Continue EFM and TOCO as ordered Continue Procardia BMZ complete T&S current

## 2014-05-31 MED ORDER — NIFEDIPINE 20 MG PO CAPS: 20.0000 mg | ORAL_CAPSULE | Freq: Four times a day (QID) | ORAL | Status: DC

## 2014-05-31 NOTE — Progress Notes (Signed)
Patient is eager to be discharged.  We discussed signs and symptoms of PTL, she understand to return if bleeding occurs. She will follow up at her doctors appt Wednesday at 2 p.m.

## 2014-05-31 NOTE — Progress Notes (Signed)
Patient ID: Anne Rodriguez, female   DOB: Dec 27, 1985, 28 y.o.   MRN: [id number] HD #5 32 0/7 weeks Third trimester Bleeding- ? Chronic Abruption  S: Good FM. No  LOF. No bleeding. No regular contractions. Occ brownish dc. No CP or SOB. No nausea. No vomiting.  O: Patient Vitals for the past 24 hrs:  BP Temp Temp src Pulse Resp Weight  05/31/14 1200 110/75 mmHg 97.8 F (36.6 C) Oral 107 18 -  05/31/14 1100 - - - - - 81.33 kg (179 lb 4.8 oz)  05/31/14 0810 121/75 mmHg 98.3 F (36.8 C) Oral 96 18 -  05/31/14 0612 117/66 mmHg 98.2 F (36.8 C) Oral 105 18 -  05/31/14 0005 110/70 mmHg 98.2 F (36.8 C) Oral 107 18 -  05/30/14 1924 121/71 mmHg 98.5 F (36.9 C) Oral 119 18 -  05/30/14 1608 117/73 mmHg 98.3 F (36.8 C) Oral 113 18 -  05/30/14 1500 - - - - - 83.326 kg (183 lb 11.2 oz)   NCAT HEENT: nl Lungs: CTA CV: RRR Abd: Gravid , NT No CVAT Ext : neg C/c/e Neuro: non focal Skin : intact  Reactive NST x 3 noted. Irregular contractions. CAtegory I tracing  ImP: 32 1/7 week IUP Non Severe Chronic Abruption. Reassuring FHR surveillance Clinically stable. Sono 6/16 c/w AGA fetus. No bleeding x 5 days Preterm contractions - stable on Procardia  Plan: DC home Continue Procardia at home BMZ complete Fu office weekly(Wed)

## 2014-07-06 NOTE — Discharge Summary (Signed)
Patient ID: Anne Rodriguez MRN: [id number] DOB/AGE: 05-Nov-1986 28 y.o.  Admit date: 05/26/2014 Discharge date: 05/31/14  Admission Diagnoses: 31 WEEKS BLEEDING  Discharge Diagnoses: 31 WEEKS BLEEDING, chronic abruption         Discharged Condition: stable  Hospital Course: admitted with 3rd episode vaginal bleeding. Admitted for close observation and fetal evaluation. Pt received repeat BMZ dose. Bleeding slowed. Fetal evaluation remained reactive with u/s showing no evidence of previa and no visualized abruption. Pt started on procardia to decrease irritibility.   Consults: None and MFM  Treatments: IV hydration, steroids: BMZ and procardia Disposition: home     Medication List         folic acid 800 MCG tablet  Commonly known as:  FOLVITE  Take 800 mcg by mouth at bedtime.     NIFEdipine 20 MG capsule  Commonly known as:  PROCARDIA  Take 1 capsule (20 mg total) by mouth every 6 (six) hours.     prenatal multivitamin Tabs tablet  Take 1 tablet by mouth at bedtime.         Signed: Lendon ColonelFOGLEMAN,Erich Kochan A., MD MD 07/06/2014, 7:39 AM

## 2014-07-08 ENCOUNTER — Inpatient Hospital Stay (HOSPITAL_COMMUNITY): Payer: BC Managed Care – PPO

## 2014-07-08 ENCOUNTER — Inpatient Hospital Stay (HOSPITAL_COMMUNITY)
Admission: AD | Admit: 2014-07-08 | Discharge: 2014-07-11 | DRG: 774 | Disposition: A | Discharge status: Home / Self Care | Payer: BC Managed Care – PPO | Source: Ambulatory Visit | Attending: Obstetrics & Gynecology | Admitting: Obstetrics & Gynecology

## 2014-07-08 ENCOUNTER — Encounter (HOSPITAL_COMMUNITY): Payer: Self-pay

## 2014-07-08 DIAGNOSIS — Z87891 Personal history of nicotine dependence: Secondary | ICD-10-CM

## 2014-07-08 DIAGNOSIS — IMO0001 Reserved for inherently not codable concepts without codable children: Secondary | ICD-10-CM

## 2014-07-08 DIAGNOSIS — O4590 Premature separation of placenta, unspecified, unspecified trimester: Secondary | ICD-10-CM | POA: Diagnosis present

## 2014-07-08 DIAGNOSIS — O9903 Anemia complicating the puerperium: Secondary | ICD-10-CM | POA: Diagnosis present

## 2014-07-08 DIAGNOSIS — O4693 Antepartum hemorrhage, unspecified, third trimester: Secondary | ICD-10-CM

## 2014-07-08 DIAGNOSIS — D62 Acute posthemorrhagic anemia: Secondary | ICD-10-CM | POA: Diagnosis present

## 2014-07-08 LAB — CBC
HCT: 35.1 % — ABNORMAL LOW (ref 36.0–46.0)
Hemoglobin: 11.9 g/dL — ABNORMAL LOW (ref 12.0–15.0)
MCH: 30.1 pg (ref 26.0–34.0)
MCHC: 33.9 g/dL (ref 30.0–36.0)
MCV: 88.9 fL (ref 78.0–100.0)
Platelets: 202 10*3/uL (ref 150–400)
RBC: 3.95 MIL/uL (ref 3.87–5.11)
RDW: 14.3 % (ref 11.5–15.5)
WBC: 7.9 10*3/uL (ref 4.0–10.5)

## 2014-07-08 LAB — OB RESULTS CONSOLE HEPATITIS B SURFACE ANTIGEN: HEP B S AG: NEGATIVE

## 2014-07-08 LAB — FIBRINOGEN: Fibrinogen: 506 mg/dL — ABNORMAL HIGH (ref 204–475)

## 2014-07-08 LAB — PROTIME-INR
INR: 0.93 (ref 0.00–1.49)
Prothrombin Time: 12.5 seconds (ref 11.6–15.2)

## 2014-07-08 LAB — APTT: aPTT: 27 seconds (ref 24–37)

## 2014-07-08 LAB — OB RESULTS CONSOLE RUBELLA ANTIBODY, IGM: Rubella: IMMUNE

## 2014-07-08 LAB — OB RESULTS CONSOLE RPR: RPR: NONREACTIVE

## 2014-07-08 LAB — OB RESULTS CONSOLE HIV ANTIBODY (ROUTINE TESTING): HIV: NONREACTIVE

## 2014-07-08 MED ORDER — OXYTOCIN 40 UNITS IN LACTATED RINGERS INFUSION - SIMPLE MED
1.0000 m[IU]/min | INTRAVENOUS | Status: DC
  Administered 2014-07-08: 2 m[IU]/min via INTRAVENOUS
  Filled 2014-07-08: qty 1000

## 2014-07-08 MED ORDER — LACTATED RINGERS IV SOLN: 500.0000 mL | INTRAVENOUS | Status: DC | PRN

## 2014-07-08 MED ORDER — TERBUTALINE SULFATE 1 MG/ML IJ SOLN: 0.2500 mg | Freq: Once | INTRAMUSCULAR | Status: AC | PRN

## 2014-07-08 MED ORDER — FLEET ENEMA 7-19 GM/118ML RE ENEM: 1.0000 | ENEMA | RECTAL | Status: DC | PRN

## 2014-07-08 MED ORDER — PRENATAL MULTIVITAMIN CH
1.0000 | ORAL_TABLET | Freq: Every day | ORAL | Status: DC
  Filled 2014-07-08: qty 1

## 2014-07-08 MED ORDER — OXYCODONE-ACETAMINOPHEN 5-325 MG PO TABS: 1.0000 | ORAL_TABLET | ORAL | Status: DC | PRN

## 2014-07-08 MED ORDER — DOCUSATE SODIUM 100 MG PO CAPS
100.0000 mg | ORAL_CAPSULE | Freq: Every day | ORAL | Status: DC
  Filled 2014-07-08: qty 1

## 2014-07-08 MED ORDER — ONDANSETRON HCL 4 MG/2ML IJ SOLN
4.0000 mg | Freq: Four times a day (QID) | INTRAMUSCULAR | Status: DC | PRN
Start: 2014-07-08 — End: 2014-07-09

## 2014-07-08 MED ORDER — IBUPROFEN 600 MG PO TABS
600.0000 mg | ORAL_TABLET | Freq: Four times a day (QID) | ORAL | Status: DC | PRN
  Administered 2014-07-09: 600 mg via ORAL
  Filled 2014-07-08: qty 1

## 2014-07-08 MED ORDER — LIDOCAINE HCL (PF) 1 % IJ SOLN
30.0000 mL | INTRAMUSCULAR | Status: AC | PRN
  Administered 2014-07-09: 30 mL via SUBCUTANEOUS
  Administered 2014-07-09: 0.2 mL via SUBCUTANEOUS
  Filled 2014-07-08 (×2): qty 30

## 2014-07-08 MED ORDER — ACETAMINOPHEN 325 MG PO TABS: 650.0000 mg | ORAL_TABLET | ORAL | Status: DC | PRN

## 2014-07-08 MED ORDER — LACTATED RINGERS IV SOLN
INTRAVENOUS | Status: DC
Start: 2014-07-08 — End: 2014-07-09
  Administered 2014-07-09 (×2): via INTRAVENOUS

## 2014-07-08 MED ORDER — ACETAMINOPHEN 325 MG PO TABS
650.0000 mg | ORAL_TABLET | ORAL | Status: DC | PRN
  Filled 2014-07-08: qty 2

## 2014-07-08 MED ORDER — CITRIC ACID-SODIUM CITRATE 334-500 MG/5ML PO SOLN
30.0000 mL | ORAL | Status: DC | PRN
Start: 2014-07-08 — End: 2014-07-09

## 2014-07-08 MED ORDER — ZOLPIDEM TARTRATE 5 MG PO TABS: 5.0000 mg | ORAL_TABLET | Freq: Every evening | ORAL | Status: DC | PRN

## 2014-07-08 MED ORDER — CALCIUM CARBONATE ANTACID 500 MG PO CHEW
2.0000 | CHEWABLE_TABLET | ORAL | Status: DC | PRN
  Filled 2014-07-08: qty 2

## 2014-07-08 MED ORDER — BUTORPHANOL TARTRATE 1 MG/ML IJ SOLN
1.0000 mg | INTRAMUSCULAR | Status: DC | PRN
  Administered 2014-07-09 (×2): 1 mg via INTRAVENOUS
  Filled 2014-07-08 (×2): qty 1

## 2014-07-08 MED ORDER — OXYTOCIN 40 UNITS IN LACTATED RINGERS INFUSION - SIMPLE MED: 62.5000 mL/h | INTRAVENOUS | Status: DC

## 2014-07-08 MED ORDER — OXYTOCIN BOLUS FROM INFUSION
500.0000 mL | INTRAVENOUS | Status: DC
  Administered 2014-07-09: 500 mL via INTRAVENOUS

## 2014-07-08 NOTE — MAU Note (Signed)
Episode of bright red bleeding in toilet this morning at 430 am. Pink spotting on pad & toilet paper since then. Some camping & irregular ctx this morning. Positive fetal movement. Last intercourse 2 days ago. Denies n/v/d/constipation/urinary complaints.

## 2014-07-08 NOTE — Consult Note (Signed)
MFM Consult Staff Note   HPI: .Anne Rodriguez is a 28 y.o. G1P0000 [redacted]w[redacted]d who presents for MFM consultation secondary to presenting with a third vaginal bleeding episode.  Her pregnancy has been complicated by a previa seen earlier in pregnancy that subsequently resolved.  Nonetheless, she has reported and been evaluated for 2 prior significant bleeding episodes and previously received antenatal steroids.  She reports this morning's bleeding filled the toilet and stained tissue with wiping.  She also passed a nickel sized clot after the TVUS.  Currently, she is contracting painfully every 2-3 minutes and experiencing spotting with    Allergy: Allergies  Allergen Reactions  . Latex Itching    Current Medications: Current facility-administered medications:acetaminophen (TYLENOL) tablet 650 mg, 650 mg, Oral, Q4H PRN, Kelly A. Ernestina Penna, MD;  calcium carbonate (TUMS - dosed in mg elemental calcium) chewable tablet 400 mg of elemental calcium, 2 tablet, Oral, Q4H PRN, Tresa Endo A. Ernestina Penna, MD;  docusate sodium (COLACE) capsule 100 mg, 100 mg, Oral, Daily, Kelly A. Ernestina Penna, MD;  prenatal multivitamin tablet 1 tablet, 1 tablet, Oral, Q1200, Kelly A. Ernestina Penna, MD zolpidem (AMBIEN) tablet 5 mg, 5 mg, Oral, QHS PRN, Tresa Endo A. Ernestina Penna, MD   Past Medical History: Past Medical History  Diagnosis Date  . Medical history non-contributory      Past Surgical History: Past Surgical History  Procedure Laterality Date  . Wisdom tooth extraction          Past OB/GYN History: OB History   Grav Para Term Preterm Abortions TAB SAB Ect Mult Living   1 0 0 0 0 0 0 0 0 0         Social History: History   Social History  . Marital Status: Married    Spouse Name: N/A    Number of Children: N/A  . Years of Education: N/A   Occupational History  . Not on file.   Social History Main Topics  . Smoking status: Former Smoker -- 0.50 packs/day for 6 years    Types: Cigarettes    Quit date: 11/10/2012  .  Smokeless tobacco: Not on file  . Alcohol Use: No  . Drug Use: No  . Sexual Activity: Yes    Birth Control/ Protection: None   Other Topics Concern  . Not on file   Social History Narrative  . No narrative on file        Family History: Noncontributory.  Review of Systems: A comprehensive review of systems was negative.   Physical Exam: Gen:  Well-appearing gravid female in no acute distress. Neuro:  Grossly intact without focal deficits.  Responds intelligently and appropriately. Abd:  Mildly obese, non-gravid in gross appearance. Cx:  2/75/ballotable, bulging bag (was closed earlier today by TVUS and previous exam by Dr. Algie Coffer)  Impressions: 1. SIUP at [redacted]w[redacted]d with resolved previa (leading edge of placenta is 3.9cm from internal os) 2. Labor 3. Vaginal hemorrhage/nonsevere abruption 4. Third episode of bleeding 5. Interval fetal growth and AFI are gestational age appropriate  Discussion: While there is no significant coagulopathy, no active hemorrhaging, and there is a reassuring fetal heart tracing, the maternal condition is consistent with labor with/without nonsevere abruption.  For nonsevere abruptions at >36 weeks, delivery is recommended by many acknowledged experts Jeraldine Loots et al 2014).  This approach balances risk of both near term morbidity for the neonate (relatively low) with risk of severe maternal-fetal morbidity/mortality in event of a sudden future severe abruption.  Given that mom and baby are stable, vaginal  delivery/trial of labor is reasonable and will likely prove successful.    Summary of Recommendations: 1. CEFM 2. Active management of labor, meaning that if needed, augmentation is recommended.  Otherwise, in face of continued progression of spontaneous labor, delivery may be expected and is recommended.   Time Spent: I spent in excess of 60 minutes in consultation with this patient to review records, evaluate her case, and provide her with an  adequate discussion and education.  More than 50% of this time was spent in direct face-to-face counseling. It was a pleasure seeing your patient in the office today.  Thank you for consultation. Please do not hesitate to contact our service for any further questions.   Page with questions.  Merideth AbbeyJ. M. Denney, MD, MS, FACOG Assistant Professor, Maternal-Fetal Medicine

## 2014-07-08 NOTE — MAU Note (Signed)
Pt reports vaginal bleeding since 0430,

## 2014-07-08 NOTE — Progress Notes (Signed)
Chart & hx reviewed S: spoke with pt regarding plan of management Pt would like conservative management. If cervical exam unchanged at 12 am, she would consider Pitocin augmentation  O: exam deferred Cat 1 tracing irreg ctx  IMP: 3rd trimester vaginal bleeding  Presumed chronic abruption P) as above

## 2014-07-08 NOTE — H&P (Signed)
Anne Rodriguez is a 28 y.o. G1P0000 at [redacted]w[redacted]d presenting for vaginal bleeding. Patient notes waking up at 4 AM with painless bright red vaginal bleeding. Patient said "filled the toilet" with blood. Since then has only noticed spotting with wiping. Pt notes occasional  contractions nothing persistent and no fundal or uterine tenderness . Good fetal movement, not leaking fluid. Patient has been followed for third trimester bleeding and has been compliant with evaluation anytime she bleeds.  P[facility]are at [facility] since 8 wks - Dated by 8 week ultrasound giving an unsure LMP due to history of oligomenorrhea - Second and third trimester vaginal bleeding. Patient was diagnosed with complete previa at 20 week anatomy scan. Patient presented at 26 weeks with acute bleeding episode. She was admitted to antepartum received betamethasone. At that time ultrasound was done and placenta only appeared to be low lying. She remained in the hospital for 3 days and was discharged to home given no further bleeding. Patient had a second significant episode of bleeding at 31 weeks. She was again admitted to the hospital and given that it had been greater than 4 weeks since her last betamethasone and received a single dose of rescue betamethasone. She remained in the hospital for several days and was discharged when bleeding subsided. She had an additional episode of bleeding shortly after discharge for which she was evaluated and found to be stable in MAU. Patient has been managed as an outpatient with no significant bleeding since 27 weeks. As an outpatient patient did use Procardia until 36 weeks to decrease irritability associated with her bleeding episodes.    Prenatal Transfer Tool  Maternal Diabetes: No Genetic Screening: Normal Maternal Ultrasounds/Referrals: Normal Fetal Ultrasounds or other Referrals:  Referred to Materal Fetal Medicine  Maternal Substance Abuse:  No Significant Maternal Medications:   None Significant Maternal Lab Results: None     OB History   Grav Para Term Preterm Abortions TAB SAB Ect Mult Living   1 0 0 0 0 0 0 0 0 0      Past Medical History  Diagnosis Date  . Medical history non-contributory    Past Surgical History  Procedure Laterality Date  . Wisdom tooth extraction     Family History: family history includes Alcohol abuse in her father and mother; Cancer in her mother; Cerebral palsy in her maternal aunt; Heart disease in her father; Miscarriages / Stillbirths in her maternal grandmother. Social History:  reports that she quit smoking about 19 months ago. Her smoking use included Cigarettes. She has a 3 pack-year smoking history. She does not have any smokeless tobacco history on file. She reports that she does not drink alcohol or use illicit drugs.  Review of Systems - Negative except Bleeding earlier this morning, minor abdominal cramping   Dilation: Closed Effacement (%): 50 Station: -3 Exam by:: Judeth Horn RNC Blood pressure 115/73, pulse 97, temperature 98.4 F (36.9 C), temperature source Oral, resp. rate 18, height 5\' 1"  (1.549 m), weight 84.369 kg (186 lb), SpO2 98.00%.  Physical Exam:  Gen: well appearing, no distress  Back: no CVAT Abd: gravid, NT, no RUQ pain, no fundal tenderness LE: No edema, equal bilaterally, non-tender Toco: Every 6-10 minutes FH: baseline 150s, accelerations present, no deceleratons, 10 beat variability  Prenatal labs: ABO, Rh: --/--/A POS (07/29 0930) Antibody: NEG (07/29 0930) Rubella:   immune RPR:   nonreactive HBsAg:   negative HIV:   negative GBS: Negative (06/19 0000)  1 hr Glucola 113  Genetic  screening normal NT screening, normal AFP Anatomy US Normal with resolution of placenta previa   CBC    Component Value Date/Time   WBC 7.9 07/08/2014 0930   RBC 3.95 07/08/2014 0930   HGB 11.9* 07/08/2014 0930   HCT 35.1* 07/08/2014 0930   PLT 202 07/08/2014 0930   MCV 88.9 07/08/2014 0930   MCH  30.1 07/08/2014 0930   MCHC 33.9 07/08/2014 0930   RDW 14.3 07/08/2014 0930    Nl coags TVUS today: no previa, no evidence abruption U/s: AFI 11.9, vtx, post placenta- 3.9 cm from os. Growth 3190. 7'1, 70%   Assessment/Plan: 28 y.o. G1P0000 at 6172w4d Third trimester. vaginal bleeding in the setting of resolved placenta previa, now posterior placenta 3.9 cm from the os. Prior episodes of vaginal bleeding in the second and third trimester most consistent with low-grade chronic abruption. Reactive fetal status with good growth, biophysical profile, amniotic fluid index and reactive NST. Small risks of prematurity at 37 weeks. Stable maternal status with no further bleeding after episode early this a.m. Stable CBC and normal coagulation studies. Unfavorable cervix. No evidence of labor. A formal MFM consult pending though brief discussion with MFM at the time of ultrasound has recommended admission to evaluate for further bleeding and expectant management at this time. The recommendations have been shared with the patient. We'll await formal consult.   Clea Dubach A. 07/08/2014 11:08 AM

## 2014-07-08 NOTE — Progress Notes (Signed)
S: called by RN : pt requesting to proceed with augmentation of labor with pitocin O: per RN VE: 4-5 cm  A/P. Latent phase Chronic placental abruption IUP @ 37 weeks P) start pitocin. Analgesic prn. Defer amniotomy

## 2014-07-08 NOTE — Progress Notes (Signed)
Care transferred to Woody Sellerhelsea Bettylee Feig, RN

## 2014-07-09 ENCOUNTER — Encounter (HOSPITAL_COMMUNITY): Payer: Self-pay

## 2014-07-09 ENCOUNTER — Inpatient Hospital Stay (HOSPITAL_COMMUNITY): Payer: BC Managed Care – PPO | Admitting: Anesthesiology

## 2014-07-09 ENCOUNTER — Encounter (HOSPITAL_COMMUNITY): Payer: BC Managed Care – PPO | Admitting: Anesthesiology

## 2014-07-09 LAB — RPR

## 2014-07-09 LAB — PREPARE RBC (CROSSMATCH)

## 2014-07-09 MED ORDER — LANOLIN HYDROUS EX OINT: TOPICAL_OINTMENT | CUTANEOUS | Status: DC | PRN

## 2014-07-09 MED ORDER — EPHEDRINE 5 MG/ML INJ
10.0000 mg | INTRAVENOUS | Status: DC | PRN
  Filled 2014-07-09: qty 4
  Filled 2014-07-09: qty 2

## 2014-07-09 MED ORDER — ZOLPIDEM TARTRATE 5 MG PO TABS: 5.0000 mg | ORAL_TABLET | Freq: Every evening | ORAL | Status: DC | PRN

## 2014-07-09 MED ORDER — ONDANSETRON HCL 4 MG PO TABS: 4.0000 mg | ORAL_TABLET | ORAL | Status: DC | PRN

## 2014-07-09 MED ORDER — DIPHENHYDRAMINE HCL 25 MG PO CAPS: 25.0000 mg | ORAL_CAPSULE | Freq: Four times a day (QID) | ORAL | Status: DC | PRN

## 2014-07-09 MED ORDER — LIDOCAINE HCL (PF) 1 % IJ SOLN
INTRAMUSCULAR | Status: DC | PRN
  Administered 2014-07-09 (×2): 5 mL

## 2014-07-09 MED ORDER — PHENYLEPHRINE 40 MCG/ML (10ML) SYRINGE FOR IV PUSH (FOR BLOOD PRESSURE SUPPORT)
80.0000 ug | PREFILLED_SYRINGE | INTRAVENOUS | Status: DC | PRN
  Filled 2014-07-09: qty 2
  Filled 2014-07-09: qty 10

## 2014-07-09 MED ORDER — TETANUS-DIPHTH-ACELL PERTUSSIS 5-2.5-18.5 LF-MCG/0.5 IM SUSP: 0.5000 mL | Freq: Once | INTRAMUSCULAR | Status: DC

## 2014-07-09 MED ORDER — IBUPROFEN 600 MG PO TABS
600.0000 mg | ORAL_TABLET | Freq: Four times a day (QID) | ORAL | Status: DC
  Administered 2014-07-10 – 2014-07-11 (×6): 600 mg via ORAL
  Filled 2014-07-09 (×6): qty 1

## 2014-07-09 MED ORDER — DIBUCAINE 1 % RE OINT: 1.0000 "application " | TOPICAL_OINTMENT | RECTAL | Status: DC | PRN

## 2014-07-09 MED ORDER — BENZOCAINE-MENTHOL 20-0.5 % EX AERO
1.0000 "application " | INHALATION_SPRAY | CUTANEOUS | Status: DC | PRN
  Administered 2014-07-09: 1 via TOPICAL
  Filled 2014-07-09: qty 56

## 2014-07-09 MED ORDER — OXYCODONE-ACETAMINOPHEN 5-325 MG PO TABS: 1.0000 | ORAL_TABLET | ORAL | Status: DC | PRN

## 2014-07-09 MED ORDER — PHENYLEPHRINE 40 MCG/ML (10ML) SYRINGE FOR IV PUSH (FOR BLOOD PRESSURE SUPPORT)
80.0000 ug | PREFILLED_SYRINGE | INTRAVENOUS | Status: DC | PRN
  Filled 2014-07-09: qty 2

## 2014-07-09 MED ORDER — FENTANYL 2.5 MCG/ML BUPIVACAINE 1/10 % EPIDURAL INFUSION (WH - ANES)
14.0000 mL/h | INTRAMUSCULAR | Status: DC | PRN
  Administered 2014-07-09: 14 mL/h via EPIDURAL
  Filled 2014-07-09: qty 125

## 2014-07-09 MED ORDER — SENNOSIDES-DOCUSATE SODIUM 8.6-50 MG PO TABS
2.0000 | ORAL_TABLET | ORAL | Status: DC
  Administered 2014-07-10 – 2014-07-11 (×2): 2 via ORAL
  Filled 2014-07-09 (×2): qty 2

## 2014-07-09 MED ORDER — DIPHENHYDRAMINE HCL 50 MG/ML IJ SOLN: 12.5000 mg | INTRAMUSCULAR | Status: DC | PRN

## 2014-07-09 MED ORDER — WITCH HAZEL-GLYCERIN EX PADS: 1.0000 "application " | MEDICATED_PAD | CUTANEOUS | Status: DC | PRN

## 2014-07-09 MED ORDER — LACTATED RINGERS IV SOLN
500.0000 mL | Freq: Once | INTRAVENOUS | Status: AC
Start: 2014-07-09 — End: 2014-07-09
  Administered 2014-07-09: 500 mL via INTRAVENOUS

## 2014-07-09 MED ORDER — ONDANSETRON HCL 4 MG/2ML IJ SOLN: 4.0000 mg | INTRAMUSCULAR | Status: DC | PRN

## 2014-07-09 MED ORDER — PRENATAL MULTIVITAMIN CH
1.0000 | ORAL_TABLET | Freq: Every day | ORAL | Status: DC
  Administered 2014-07-10: 1 via ORAL
  Filled 2014-07-09: qty 1

## 2014-07-09 MED ORDER — EPHEDRINE 5 MG/ML INJ
10.0000 mg | INTRAVENOUS | Status: DC | PRN
  Filled 2014-07-09: qty 2

## 2014-07-09 MED ORDER — SIMETHICONE 80 MG PO CHEW: 80.0000 mg | CHEWABLE_TABLET | ORAL | Status: DC | PRN

## 2014-07-09 MED ORDER — FENTANYL 2.5 MCG/ML BUPIVACAINE 1/10 % EPIDURAL INFUSION (WH - ANES)
INTRAMUSCULAR | Status: DC | PRN
  Administered 2014-07-09: 14 mL/h via EPIDURAL

## 2014-07-09 NOTE — Anesthesia Procedure Notes (Signed)
Epidural Patient location during procedure: OB Start time: 07/09/2014 8:50 AM  Staffing Anesthesiologist: Brayton CavesJACKSON, Levell Tavano Performed by: anesthesiologist   Preanesthetic Checklist Completed: patient identified, site marked, surgical consent, pre-op evaluation, timeout performed, IV checked, risks and benefits discussed and monitors and equipment checked  Epidural Patient position: sitting Prep: site prepped and draped and DuraPrep Patient monitoring: continuous pulse ox and blood pressure Approach: midline Location: L3-L4 Injection technique: LOR air  Needle:  Needle type: Tuohy  Needle gauge: 17 G Needle length: 9 cm and 9 Needle insertion depth: 6 cm Catheter type: closed end flexible Catheter size: 19 Gauge Catheter at skin depth: 11 cm Test dose: negative  Assessment Events: blood not aspirated, injection not painful, no injection resistance, negative IV test and no paresthesia  Additional Notes Patient identified.  Risk benefits discussed including failed block, incomplete pain control, headache, nerve damage, paralysis, blood pressure changes, nausea, vomiting, reactions to medication both toxic or allergic, and postpartum back pain.  Patient expressed understanding and wished to proceed.  All questions were answered.  Sterile technique used throughout procedure and epidural site dressed with sterile barrier dressing. No paresthesia or other complications noted.The patient did not experience any signs of intravascular injection such as tinnitus or metallic taste in mouth nor signs of intrathecal spread such as rapid motor block. Please see nursing notes for vital signs.

## 2014-07-09 NOTE — Anesthesia Preprocedure Evaluation (Signed)

## 2014-07-09 NOTE — Progress Notes (Signed)
S: ranks pain like severe menstrual cramps  O: Pitocin  VE deferred. No vaginal bleeding  tracing cat 1 baseline 125 (+) accels Ctx q 2-3 mins  IMP: chronic placental abruption Arrest of dilation due to suboptimal ctx P) cont pitocin

## 2014-07-09 NOTE — Progress Notes (Signed)
Patient ID: Anne Rodriguez, female   DOB: 09/09/1986, 28 y.o.   MRN: [id number] Subjective: Doing well, no pain, epidural working well.   Objective: BP 127/94  Pulse 92  Temp(Src) 98.9 F (37.2 C) (Oral)  Resp 18  Ht 5\' 1"  (1.549 m)  Wt 186 lb (84.369 kg)  BMI 35.16 kg/m2  SpO2 100%  FHT:  FHR: 135 bpm, variability: moderate,  accelerations:  Present,  decelerations:  Absent UC:   regular, every 3 minutes SVE:   Dilation: 8.5 Effacement (%): 100 Station: -1 Exam by:: dr Olumide Dolinger AROM, clear fluid. Station needs to progress, turn to left exaggerated Sims. Reassess in 1 hr.   Assessment / Plan: Augmentation of labor, progressing well  Fetal Wellbeing:  Category I Pain Control:  Epidural  Anticipated MOD:  NSVD  Anne Rodriguez 07/09/2014, 2:37 PM

## 2014-07-10 LAB — CBC
HEMATOCRIT: 30.1 % — AB (ref 36.0–46.0)
Hemoglobin: 10.4 g/dL — ABNORMAL LOW (ref 12.0–15.0)
MCH: 30.7 pg (ref 26.0–34.0)
MCHC: 34.6 g/dL (ref 30.0–36.0)
MCV: 88.8 fL (ref 78.0–100.0)
Platelets: 192 10*3/uL (ref 150–400)
RBC: 3.39 MIL/uL — AB (ref 3.87–5.11)
RDW: 14.3 % (ref 11.5–15.5)
WBC: 11.5 10*3/uL — AB (ref 4.0–10.5)

## 2014-07-10 NOTE — Lactation Note (Signed)
This note was copied from the chart of Anne Rodriguez. Lactation Consultation Note Baby had Respiratory distress after birth. Baby was sleeping, heard mild grunting occasional sounds. Mom said he had been doing that off and on. Mom stated baby was latching and feeding well w/o difficulty, no breathing problems noted during feedings. Mom has good everted nipples. Hand expression taught and noted colostrum easily expressed. DEBP set up for post-pumping feeding d/t baby 37 5/7 wks. To stimulate breast. Instructed any colostrum that is pumped to give to baby via syring. Mom shown how to use DEBP & how to disassemble, clean, & reassemble parts.Mom encouraged to feed baby 8-12 times/24 hours and with feeding cues. Encouraged to call for assistance if needed and to verify proper latch.WH/LC brochure given w/resources, support groups and LC services.Reviewed Baby & Me book's Breastfeeding Basics. Mom reports + breast changes w/pregnancy. Encouraged comfort during BF so colostrum flows better and mom will enjoy the feeding longer. Taking deep breaths and breast massage during BF.  Patient Name: Anne Peggye LeyMarcia Siebenaler YNWGN'FToday's Date: 07/10/2014     Maternal Data    Feeding    LATCH Score/Interventions                      Lactation Tools Discussed/Used     Consult Status      Kymoni Lesperance, Diamond NickelLAURA G 07/10/2014, 5:14 AM

## 2014-07-10 NOTE — Anesthesia Postprocedure Evaluation (Signed)
Anesthesia Post Note  Patient: Anne Rodriguez  Procedure(s) Performed: * No procedures listed *  Anesthesia type: Epidural  Patient location: Mother/Baby  Post pain: Pain level controlled  Post assessment: Post-op Vital signs reviewed  Last Vitals:  Filed Vitals:   07/10/14 0000  BP: 113/67  Pulse: 100  Temp: 36.2 C  Resp: 20    Post vital signs: Reviewed  Level of consciousness: awake  Complications: No apparent anesthesia complications

## 2014-07-10 NOTE — Progress Notes (Signed)
Patient ID: Peggye LeyMarcia Stilley, female   DOB: 20-Mar-1986, 28 y.o.   MRN: [id number] PPD # 1 SVD  S:  Reports feeling well             Tolerating po/ No nausea or vomiting             Bleeding is light             Pain controlled with ibuprofen (OTC)             Up ad lib / ambulatory / voiding without difficulties    Newborn  Information for the patient's newborn:  Almon Registerilley, Boy Annell [[id number]][[id number]]  female  breast feeding  / Circumcision planning   O:  A & O x 3, in no apparent distress              VS:  Filed Vitals:   07/09/14 1927 07/09/14 1944 07/09/14 2049 07/10/14 0000  BP: 117/86 109/74 123/64 113/67  Pulse: 105 98 85 100  Temp:  99.2 F (37.3 C) 99.1 F (37.3 C) 97.1 F (36.2 C)  TempSrc:  Oral Oral Oral  Resp: 18 20 20 20   Height:      Weight:      SpO2:  98%  97%    LABS:  Recent Labs  07/08/14 0930 07/10/14 0500  WBC 7.9 11.5*  HGB 11.9* 10.4*  HCT 35.1* 30.1*  PLT 202 192    Blood type: --/--/A POS (07/29 0930)  Rubella: Immune (07/29 1814)   I&O: I/O last 3 completed shifts: In: -  Out: 1300 [Urine:1000; Blood:300]             Lungs: Clear and unlabored  Heart: regular rate and rhythm / no murmurs  Abdomen: soft, non-tender, non-distended              Fundus: firm, non-tender, U-1  Perineum: 2nd degree repair healing well  Lochia: minimal  Extremities: no edema, no calf pain or tenderness, no Homans    A/P: PPD # 1  28 y.o., G1P1001   Principal Problem:    Postpartum care following vaginal delivery (7/30)   Doing well - stable status    Routine post partum orders  Anticipate discharge tomorrow    Raelyn MoraAWSON, Erron Wengert, M, MSN, CNM 07/10/2014, 9:32 AM

## 2014-07-10 NOTE — Lactation Note (Signed)
This note was copied from the chart of Boy Peggye LeyMarcia Bianchini. Lactation Consultation Note  Patient Name: Boy Peggye LeyMarcia Desta ZOXWR'UToday's Date: 07/10/2014 Reason for consult: Follow-up assessment Mom had baby latched when I arrived. LC adjusted baby to be tummy to tummy for deeper latch. Baby has been nursing for 15 minutes and now sleepy at the breast. Mom reports she feels BF is going well. Baby has been to the breast 6 times in the 1st 24 hours for greater than 10 minutes. 2 voids/2 stools. BF basics reviewed with Mom. Cluster feeding discussed. She has not been pumping. LC advised Mom to keep baby at the breast, will revisit the need to pump if increase weight loss, inadequate voids/stools, or signs that BF is not going well. Encouraged Mom to call if she would like assist.   Maternal Data    Feeding Feeding Type: Breast Milk Length of feed: 25 min  LATCH Score/Interventions Latch: Grasps breast easily, tongue down, lips flanged, rhythmical sucking.  Audible Swallowing: A few with stimulation Intervention(s): Skin to skin  Type of Nipple: Everted at rest and after stimulation  Comfort (Breast/Nipple): Soft / non-tender     Hold (Positioning): No assistance needed to correctly position infant at breast.  LATCH Score: 9  Lactation Tools Discussed/Used Tools: Pump Breast pump type: Double-Electric Breast Pump   Consult Status Consult Status: Follow-up Date: 07/11/14 Follow-up type: In-patient    Alfred LevinsGranger, Gailene Youkhana Ann 07/10/2014, 4:05 PM

## 2014-07-10 NOTE — Progress Notes (Signed)
S: epidural  O: Pitocin  Tracing: baseline 140 reactive cat 1 Ctx q 2 mins VE per RN 7 cm/100/0 Brown bloody show  IMP: protracted active phase Chronic placental abruption IUP @ 37 4/7 weeks P) pt advised will have Dr Juliene PinaMody do AROM. Agree with plan

## 2014-07-11 MED ORDER — POLYSACCHARIDE IRON COMPLEX 150 MG PO CAPS: 150.0000 mg | ORAL_CAPSULE | Freq: Every day | ORAL | Status: DC

## 2014-07-11 MED ORDER — IBUPROFEN 600 MG PO TABS: 600.0000 mg | ORAL_TABLET | Freq: Four times a day (QID) | ORAL | Status: DC

## 2014-07-11 MED ORDER — MAGNESIUM OXIDE 400 (241.3 MG) MG PO TABS: 200.0000 mg | ORAL_TABLET | Freq: Every day | ORAL | Status: DC

## 2014-07-11 MED ORDER — MAGNESIUM OXIDE 400 (241.3 MG) MG PO TABS
200.0000 mg | ORAL_TABLET | Freq: Every day | ORAL | Status: DC
  Administered 2014-07-11: 200 mg via ORAL
  Filled 2014-07-11: qty 0.5

## 2014-07-11 NOTE — Anesthesia Postprocedure Evaluation (Signed)
Anesthesia Post Note  Patient: Anne Rodriguez  Procedure(s) Performed: * No procedures listed *  Anesthesia type: Epidural  Patient location: Mother/Baby  Post pain: Pain level controlled  Post assessment: Post-op Vital signs reviewed  Last Vitals:  Filed Vitals:   07/11/14 0516  BP: 131/86  Pulse: 87  Temp: 36.6 C  Resp: 20    Post vital signs: Reviewed  Level of consciousness:alert  Complications: No apparent anesthesia complications

## 2014-07-11 NOTE — Addendum Note (Signed)
Addendum created 07/11/14 0949 by Graciela HusbandsWynn O Ioana Louks, CRNA   Modules edited: Charges VN, Notes Section   Notes Section:  File: [id number]

## 2014-07-11 NOTE — Progress Notes (Signed)
PPD #2- SVD  Subjective:   Reports feeling well, ready for d/c Tolerating po/ No nausea or vomiting Bleeding is light Pain controlled with Motrin Up ad lib / ambulatory / voiding without problems Newborn: breastfeeding  / Circumcision: done   Objective:   VS: VS:  Filed Vitals:   07/10/14 0000 07/10/14 0930 07/10/14 1712 07/11/14 0516  BP: 113/67 120/80 138/74 131/86  Pulse: 100 90 82 87  Temp: 97.1 F (36.2 C) 99 F (37.2 C) 97.3 F (36.3 C) 97.8 F (36.6 C)  TempSrc: Oral Oral Oral Oral  Resp: 20 18 20 20   Height:      Weight:      SpO2: 97%       LABS:  Recent Labs  07/08/14 0930 07/10/14 0500  WBC 7.9 11.5*  HGB 11.9* 10.4*  PLT 202 192   Blood type: --/--/A POS (07/29 0930) Rubella: Immune (07/29 1814)                I&O: Intake/Output     07/31 0701 - 08/01 0700 08/01 0701 - 08/02 0700   Urine (mL/kg/hr)     Blood     Total Output       Net              Physical Exam: Alert and oriented X3 Abdomen: soft, non-tender, non-distended  Fundus: firm, non-tender, U-2 Perineum: Well approximated, no significant erythema, edema, or drainage; healing well. Lochia: small Extremities: Trace BLE edema, no calf pain or tenderness    Assessment: PPD # 2 G1P1001/ S/P:induced vaginal, 2nd degree laceration ABL anemia, mild Doing well - stable for discharge home   Plan: Discharge home RX's:  Ibuprofen 600mg  po Q 6 hrs prn pain #30 Refill x 0 Niferex 150mg  po QD #30 Refill x 1 Routine pp visit in [employer] booklet given    Anne LarryBHAMBRI, Sujey Gundry, N MSN, CNM 07/11/2014, 9:12 AM

## 2014-07-11 NOTE — Discharge Summary (Signed)
Obstetric Discharge Summary Reason for Admission: vaginal bleeding, chronic abruption Prenatal Procedures: NST, ultrasound, BMZ, MFM consult Intrapartum Procedures: Pitocin induction, AROM, epidural, SVD Postpartum Procedures: none Complications-Operative and Postpartum: 2nd degree perineal laceration and mild anemia Hemoglobin  Date Value Ref Range Status  07/10/2014 10.4* 12.0 - 15.0 g/dL Final     HCT  Date Value Ref Range Status  07/10/2014 30.1* 36.0 - 46.0 % Final    Physical Exam:  General: alert and cooperative Lochia: appropriate Uterine Fundus: firm Incision: healing well, no significant drainage, no dehiscence, no significant erythema DVT Evaluation: No evidence of DVT seen on physical exam. Negative Homan's sign. No cords or calf tenderness. No significant calf/ankle edema.  Discharge Diagnoses: Preterm delivered  Discharge Information: Date: 07/11/2014 Activity: pelvic rest Diet: routine Medications: PNV, Ibuprofen and Iron Condition: stable Instructions: refer to practice specific booklet Discharge to: home Follow-up Information   [facility] Schedule an appointment as soon as possible for a visit in 6 weeks.   Specialty:  Obstetrics and Gynecology   Contact information:   [address] Severe[address] [place] [id number] [phone]       Newborn Data: Live born female on 07/09/14 Birth Weight: 7 lb 1.9 oz (3230 g) APGAR: 8, 8  Home with mother.  BHAMBRI, MELANIE, N 07/11/2014, 12:20 PM

## 2014-07-12 LAB — TYPE AND SCREEN
ABO/RH(D): A POS
ANTIBODY SCREEN: NEGATIVE
Unit division: 0
Unit division: 0

## 2014-10-12 ENCOUNTER — Encounter (HOSPITAL_COMMUNITY): Payer: Self-pay

## 2015-01-03 IMAGING — US US OB COMP +14 WK
2 series · 12 of 28 positions shown · non-contrast
Comparison: none

[Series 1: us ob comp +14 wk · 53 acquisitions, 10 frames shown (1 of 2)]
[im 3/53]
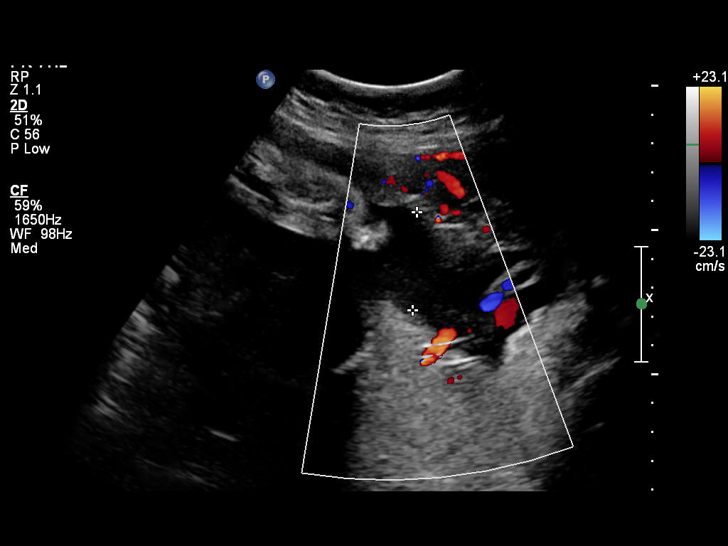
[im 8/53]
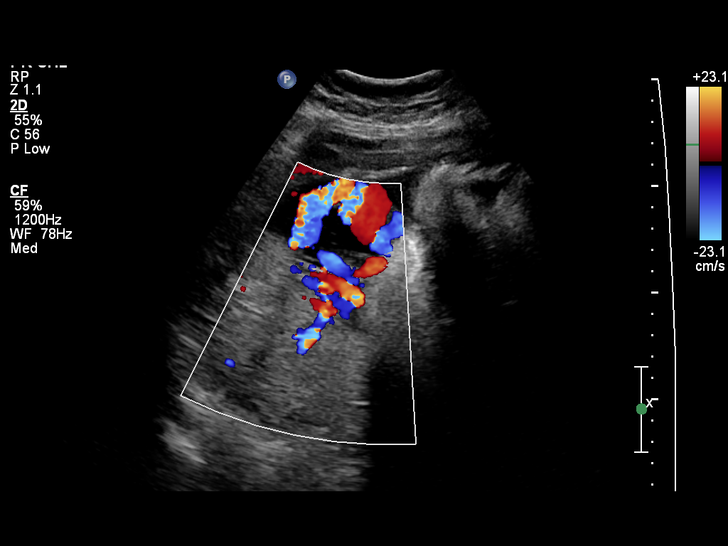
[im 12/53]
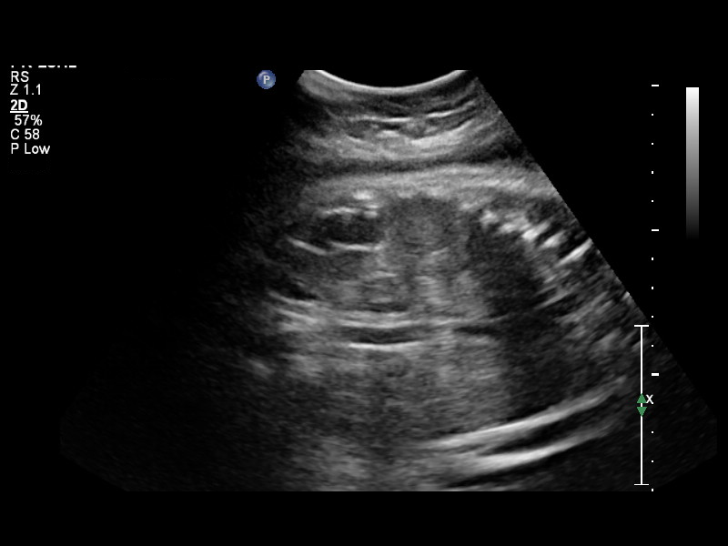
[im 19/53]
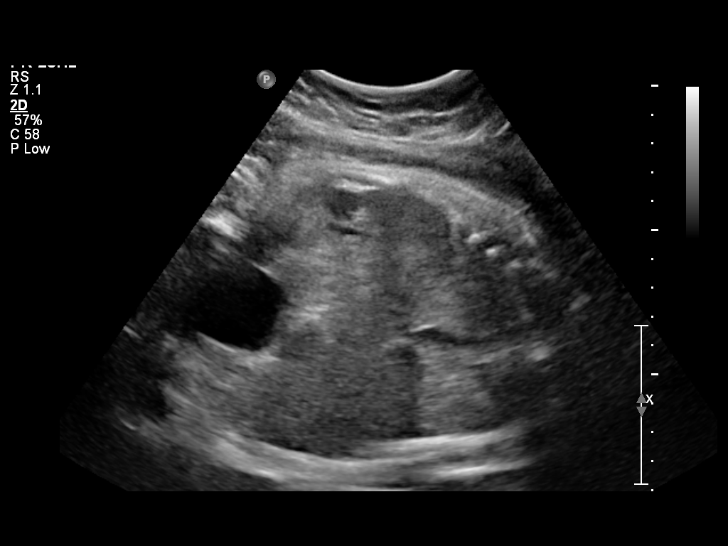
[im 24/53]
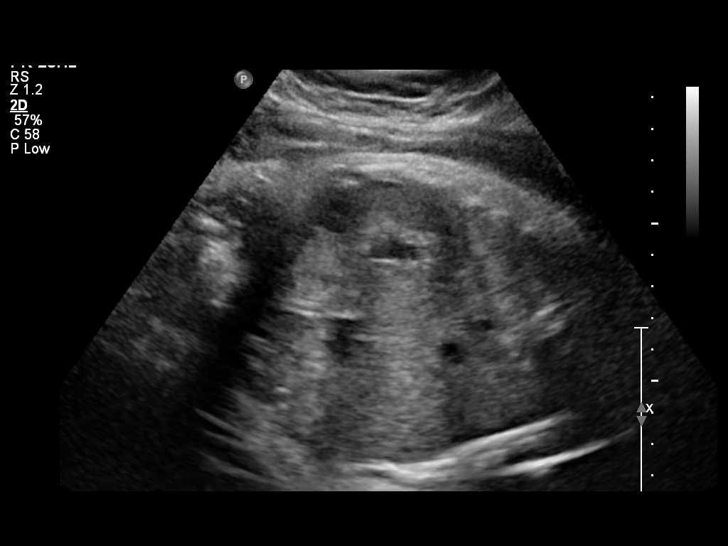
[im 29/53]
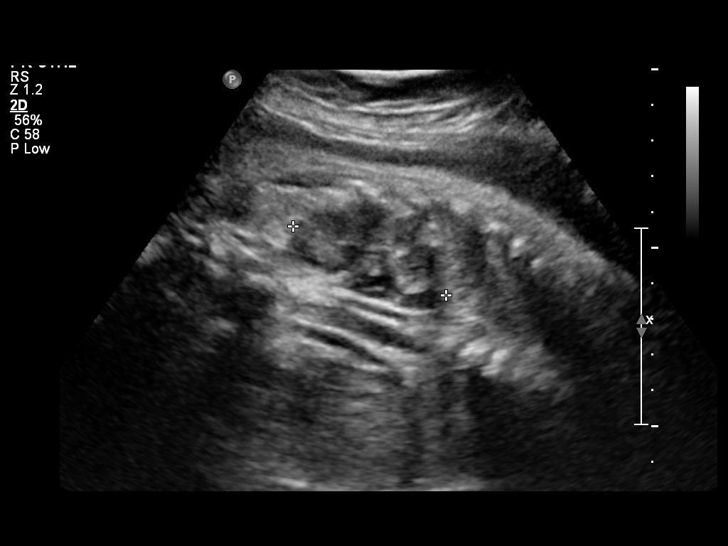
[im 36/53]
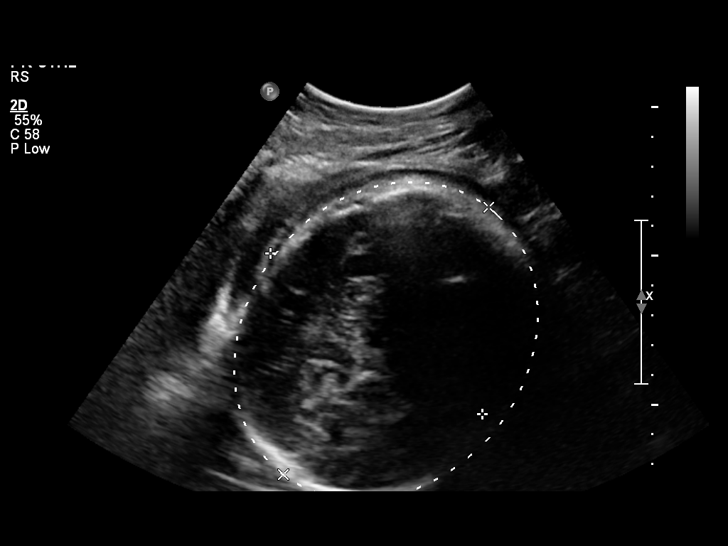
[im 41/53]
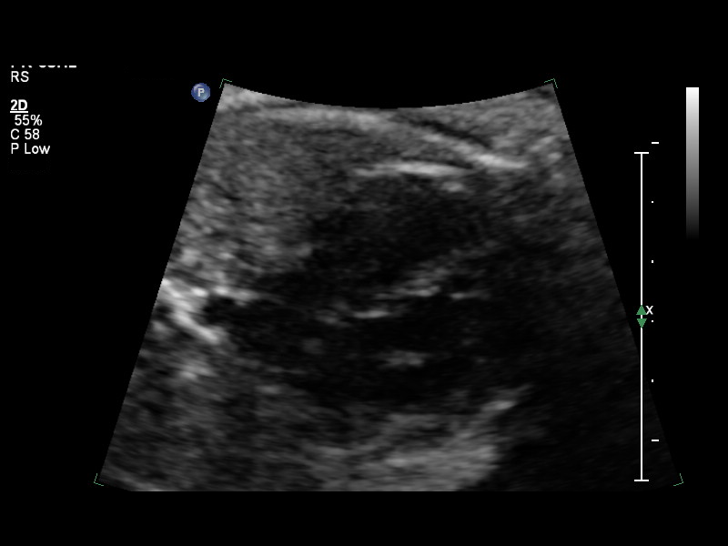
[im 45/53]
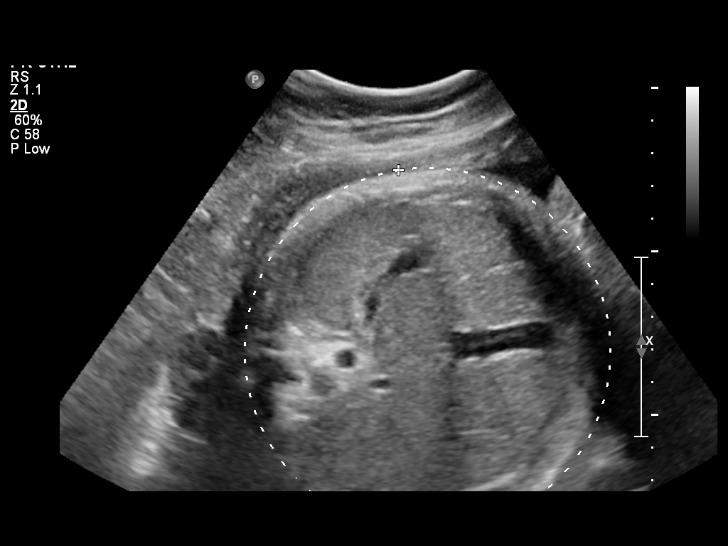
[im 53/53]
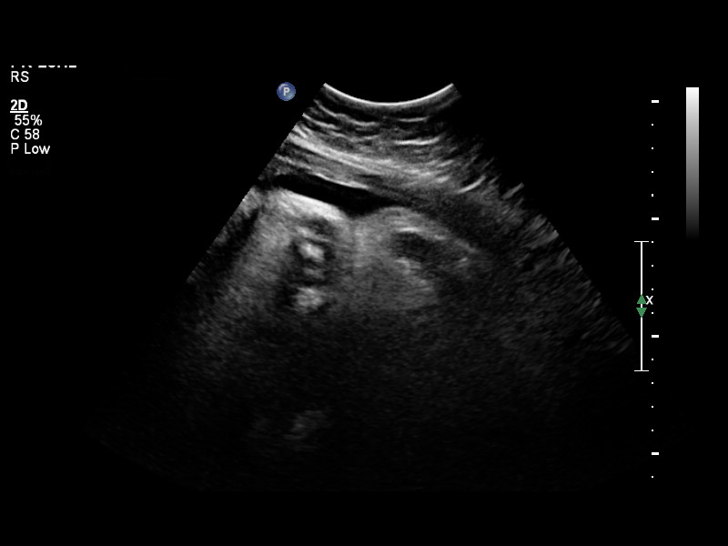

[Series 1: us ob comp +14 wk · 2 of 11 slices shown (2 of 2)]
[im 3/11]
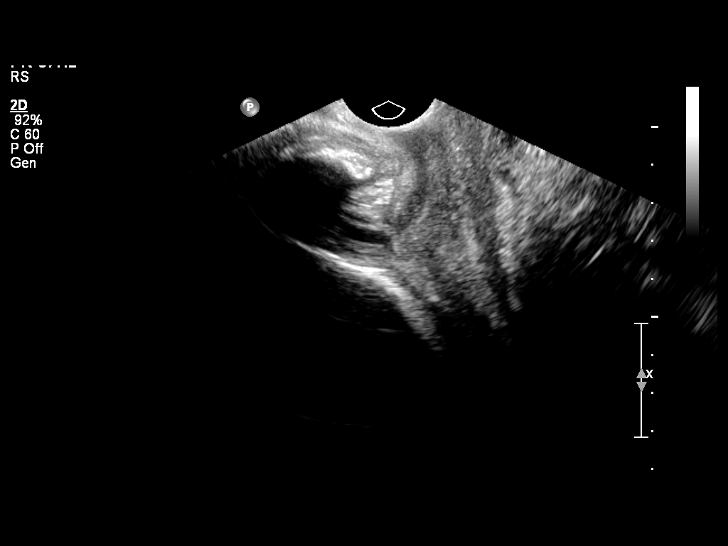
[im 8/11]
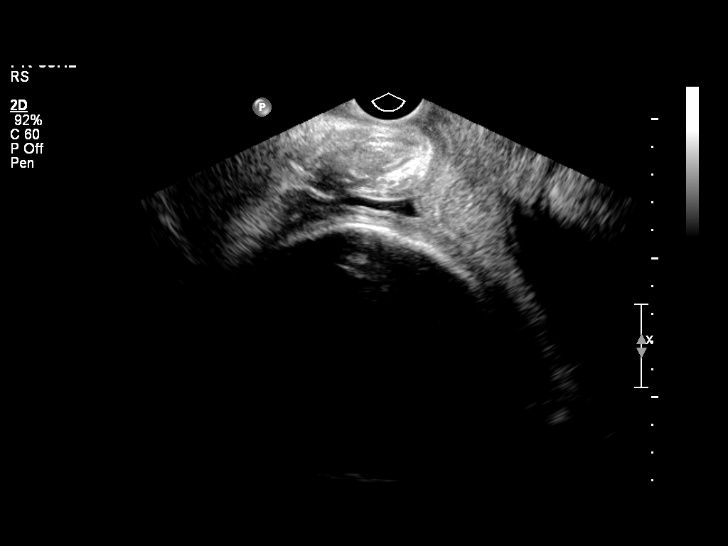

[12 of 28 positions shown; findings below may reference images not displayed]

OBSTETRICS REPORT
                      (Signed Final 07/08/2014 [DATE])

Service(s) Provided

 US OB COMP + 14 WK                                    76805.1
 US MFM OB TRANSVAGINAL                                76817.2
Indications

 Non-reactive NST
 Vaginal bleeding, unknown etiology
Fetal Evaluation

 Num Of Fetuses:    1
 Fetal Heart Rate:  132                          bpm
 Cardiac Activity:  Observed
 Presentation:      Cephalic
 Placenta:          Posterior, above cervical
                    os
 P. Cord            Not well visualized
 Insertion:

 Comment:    [DATE] BPP in 13 mins.

 Amniotic Fluid
 AFI FV:      Subjectively within normal limits
 AFI Sum:     11.98   cm       40  %Tile     Larg Pckt:    3.75  cm
 RUQ:   3.39    cm   RLQ:    1.94   cm    LUQ:   3.75    cm   LLQ:    2.9    cm
Biophysical Evaluation

 Amniotic F.V:   Within normal limits       F. Tone:        Observed
 F. Movement:    Observed                   Score:          [DATE]
 F. Breathing:   Observed
Biometry

 BPD:     91.4  mm     G. Age:  37w 1d                CI:        80.12   70 - 86
                                                      FL/HC:      21.5   20.9 -

 HC:     322.6  mm     G. Age:  36w 3d        9  %    HC/AC:      0.94   0.92 -

 AC:     344.6  mm     G. Age:  38w 3d       83  %    FL/BPD:     75.9   71 - 87
 FL:      69.4  mm     G. Age:  35w 4d       10  %    FL/AC:      20.1   20 - 24

 Est. FW:    7268  gm      7 lb 1 oz     70  %
Gestational Age

 Clinical EDD:  37w 4d                                        EDD:   07/25/14
 U/S Today:     36w 6d                                        EDD:   07/30/14
 Best:          37w 4d     Det. By:  Clinical EDD             EDD:   07/25/14
Anatomy

 Cranium:          Appears normal         Aortic Arch:      Not well visualized
 Fetal Cavum:      Not well visualized    Ductal Arch:      Not well visualized
 Ventricles:       Not well visualized    Diaphragm:        Appears normal
 Choroid Plexus:   Appears normal         Stomach:          Appears normal, left
                                                            sided
 Cerebellum:       Appears normal         Abdomen:          Appears normal
 Posterior Fossa:  Not well visualized    Abdominal Wall:   Not well visualized
 Nuchal Fold:      Not applicable (>20    Cord Vessels:     Appears normal (3
                   wks GA)                                  vessel cord)
 Face:             Not well visualized    Kidneys:          Appear normal
 Lips:             Not well visualized    Bladder:          Appears normal
 Heart:            Appears normal         Spine:            Not well visualized
                   (4CH, axis, and
                   situs)
 RVOT:             Not well visualized    Lower             Visualized
                                          Extremities:
 LVOT:             Not well visualized    Upper             Visualized
                                          Extremities:

 Other:  Fetus appears to be a male. Nasal bone visualized. Technically
         difficult due to fetal position.
Cervix Uterus Adnexa

 Cervical Length:    1.6      cm

 Cervix:       Measured transvaginally.
Impression

 Single IUP at 20w2d on remote interpretation of follow up
 ultrasound
 Fetus in cephalic presentation
 EFW 70th%
 No dysmorphic features are demonstrated, noting limitations
 as documented above
 Placenta is implanted along the posterior uterine wall
 A persistently low-lying placenta (marginal previa) cannot be
 ruled out with provided transabdominal imaging, noting that
 our last evaluation demonstrated the leading edge of the
 placenta is 1.5 cm from the internal os
 There are no findings consistent with abruption (no
 retroplacental sonolucency as the placenta is normally
 implanted along the posterior uterine wall with the leading
 edge wrapping around anteriorly as demonstrated by TVUS)
 Hence, TVUS was performed and there is no apparent vasa
 previa or funic presentation as demonstrated by color
 angiography via vaginal probe.
 The leading edge of the placenta is 3.9 cm from the internal
 os (NO PREVIA by TVUS)
 AFI is gestational age appropriate
 BPP [DATE]
Recommendations

 See formal MFM consultation.  In the interim, continue
 continuous external fetal monitoring.

 questions or concerns.

## 2016-08-02 ENCOUNTER — Emergency Department (HOSPITAL_BASED_OUTPATIENT_CLINIC_OR_DEPARTMENT_OTHER): Payer: BLUE CROSS/BLUE SHIELD

## 2016-08-02 ENCOUNTER — Emergency Department (HOSPITAL_BASED_OUTPATIENT_CLINIC_OR_DEPARTMENT_OTHER)
Admission: EM | Admit: 2016-08-02 | Discharge: 2016-08-02 | Disposition: A | Discharge status: Home / Self Care | Payer: BLUE CROSS/BLUE SHIELD | Attending: Emergency Medicine | Admitting: Emergency Medicine

## 2016-08-02 ENCOUNTER — Encounter (HOSPITAL_BASED_OUTPATIENT_CLINIC_OR_DEPARTMENT_OTHER): Payer: Self-pay

## 2016-08-02 DIAGNOSIS — R109 Unspecified abdominal pain: Secondary | ICD-10-CM | POA: Diagnosis present

## 2016-08-02 DIAGNOSIS — Z87891 Personal history of nicotine dependence: Secondary | ICD-10-CM | POA: Diagnosis not present

## 2016-08-02 DIAGNOSIS — K8020 Calculus of gallbladder without cholecystitis without obstruction: Secondary | ICD-10-CM | POA: Diagnosis not present

## 2016-08-02 DIAGNOSIS — N390 Urinary tract infection, site not specified: Secondary | ICD-10-CM | POA: Diagnosis not present

## 2016-08-02 HISTORY — DX: Depression, unspecified: F32.A

## 2016-08-02 HISTORY — DX: Anxiety disorder, unspecified: F41.9

## 2016-08-02 HISTORY — DX: Major depressive disorder, single episode, unspecified: F32.9

## 2016-08-02 LAB — COMPREHENSIVE METABOLIC PANEL
ALBUMIN: 4.7 g/dL (ref 3.5–5.0)
ALK PHOS: 54 U/L (ref 38–126)
ALT: 12 U/L — AB (ref 14–54)
AST: 34 U/L (ref 15–41)
Anion gap: 9 (ref 5–15)
BUN: 12 mg/dL (ref 6–20)
CALCIUM: 9.1 mg/dL (ref 8.9–10.3)
CO2: 26 mmol/L (ref 22–32)
CREATININE: 0.5 mg/dL (ref 0.44–1.00)
Chloride: 104 mmol/L (ref 101–111)
GFR calc Af Amer: >60 mL/min (ref >60)
GFR calc non Af Amer: >60 mL/min (ref >60)
Glucose, Bld: 92 mg/dL (ref 65–99)
Potassium: 3 mmol/L — ABNORMAL LOW (ref 3.5–5.1)
SODIUM: 139 mmol/L (ref 135–145)
TOTAL PROTEIN: 7.4 g/dL (ref 6.5–8.1)
Total Bilirubin: 1.6 mg/dL — ABNORMAL HIGH (ref 0.3–1.2)

## 2016-08-02 LAB — URINALYSIS, ROUTINE W REFLEX MICROSCOPIC
GLUCOSE, UA: NEGATIVE mg/dL
Hgb urine dipstick: NEGATIVE
Nitrite: NEGATIVE
PH: 6 (ref 5.0–8.0)
Protein, ur: 30 mg/dL — AB
Specific Gravity, Urine: 1.027 (ref 1.005–1.030)

## 2016-08-02 LAB — URINE MICROSCOPIC-ADD ON

## 2016-08-02 LAB — CBC WITH DIFFERENTIAL/PLATELET
BASOS ABS: 0 10*3/uL (ref 0.0–0.1)
BASOS PCT: 0 %
Eosinophils Absolute: 0.1 10*3/uL (ref 0.0–0.7)
Eosinophils Relative: 1 %
HCT: 40.6 % (ref 36.0–46.0)
HEMOGLOBIN: 14.6 g/dL (ref 12.0–15.0)
Lymphocytes Relative: 30 %
Lymphs Abs: 2.4 10*3/uL (ref 0.7–4.0)
MCH: 30.4 pg (ref 26.0–34.0)
MCHC: 36 g/dL (ref 30.0–36.0)
MCV: 84.4 fL (ref 78.0–100.0)
MONO ABS: 0.6 10*3/uL (ref 0.1–1.0)
Monocytes Relative: 8 %
NEUTROS PCT: 61 %
Neutro Abs: 5 10*3/uL (ref 1.7–7.7)
Platelets: 261 10*3/uL (ref 150–400)
RBC: 4.81 MIL/uL (ref 3.87–5.11)
RDW: 12.9 % (ref 11.5–15.5)
WBC: 8.2 10*3/uL (ref 4.0–10.5)

## 2016-08-02 LAB — PREGNANCY, URINE: Preg Test, Ur: NEGATIVE

## 2016-08-02 MED ORDER — ONDANSETRON HCL 4 MG/2ML IJ SOLN
4.0000 mg | Freq: Once | INTRAMUSCULAR | Status: AC
  Administered 2016-08-02: 4 mg via INTRAVENOUS
  Filled 2016-08-02: qty 2

## 2016-08-02 MED ORDER — HYDROCODONE-ACETAMINOPHEN 5-325 MG PO TABS: 1.0000 | ORAL_TABLET | ORAL | 0 refills | Status: AC | PRN

## 2016-08-02 MED ORDER — DEXTROSE 5 % IV SOLN
1.0000 g | Freq: Once | INTRAVENOUS | Status: AC
  Administered 2016-08-02: 1 g via INTRAVENOUS
  Filled 2016-08-02: qty 10

## 2016-08-02 MED ORDER — MORPHINE SULFATE (PF) 4 MG/ML IV SOLN
4.0000 mg | Freq: Once | INTRAVENOUS | Status: AC
  Administered 2016-08-02: 4 mg via INTRAVENOUS
  Filled 2016-08-02: qty 1

## 2016-08-02 MED ORDER — SODIUM CHLORIDE 0.9 % IV BOLUS (SEPSIS)
1000.0000 mL | Freq: Once | INTRAVENOUS | Status: AC
  Administered 2016-08-02: 1000 mL via INTRAVENOUS

## 2016-08-02 MED ORDER — ONDANSETRON HCL 4 MG PO TABS: 4.0000 mg | ORAL_TABLET | Freq: Four times a day (QID) | ORAL | 0 refills | Status: AC

## 2016-08-02 MED ORDER — SULFAMETHOXAZOLE-TRIMETHOPRIM 800-160 MG PO TABS: 1.0000 | ORAL_TABLET | Freq: Two times a day (BID) | ORAL | 0 refills | Status: AC

## 2016-08-02 NOTE — ED Triage Notes (Signed)
C/o abd pain, n/v/d x 3 days-NAD-steady gait 

## 2016-08-02 NOTE — ED Provider Notes (Signed)
[facility] Provider Note   CSN: [id number] Arrival date & time: 08/02/16  1944     History   Chief Complaint Chief Complaint  Patient presents with  . Abdominal Pain    HPI Anne Rodriguez is a 30 y.o. female.  Pt here with abd pain and n/v.  She said that this pain is worse than labor.  Pt has not been urinating much due to n/v.        Past Medical History:  Diagnosis Date  . Anxiety   . Depression   . Medical history non-contributory   . Postpartum care following vaginal delivery (7/30) 07/09/2014    Patient Active Problem List   Diagnosis Date Noted  . SVD (spontaneous vaginal delivery) 07/09/2014  . Postpartum care following vaginal delivery (7/30) 07/09/2014  . Pregnancy with third trimester bleeding, antepartum 07/08/2014  . Active labor at term 07/08/2014  . Antepartum placental abruption in third trimester 05/26/2014  . Pregnancy with third trimester bleeding 04/22/2014  . Third trimester bleeding, marginal bleed /low lying placenta - 26 weeks 04/22/2014    Past Surgical History:  Procedure Laterality Date  . WISDOM TOOTH EXTRACTION      OB History    Gravida Para Term Preterm AB Living   1 1 1  0 0 1   SAB TAB Ectopic Multiple Live Births   0 0 0 0 1       Home Medications    Prior to Admission medications   Medication Sig Start Date End Date Taking? Authorizing Provider  HYDROcodone-acetaminophen (NORCO/VICODIN) 5-325 MG tablet Take 1 tablet by mouth every 4 (four) hours as needed. 08/02/16   Jacalyn LefevreJulie Eden Rho, MD  ondansetron (ZOFRAN) 4 MG tablet Take 1 tablet (4 mg total) by mouth every 6 (six) hours. 08/02/16   Jacalyn LefevreJulie Etna Forquer, MD  sulfamethoxazole-trimethoprim (BACTRIM DS,SEPTRA DS) 800-160 MG tablet Take 1 tablet by mouth 2 (two) times daily. 08/02/16 08/09/16  Jacalyn LefevreJulie Olar Santini, MD    Family History Family History  Problem Relation Age of Onset  . Cancer Mother   . Alcohol abuse Mother   . Heart disease Father   . Alcohol abuse  Father   . Cerebral palsy Maternal Aunt   . Miscarriages / Stillbirths Maternal Grandmother     Social History Social History  Substance Use Topics  . Smoking status: Former Smoker    Packs/day: 0.50    Years: 6.00    Types: Cigarettes    Quit date: 11/10/2012  . Smokeless tobacco: Never Used  . Alcohol use No     Allergies   Latex   Review of Systems Review of Systems  Gastrointestinal: Positive for abdominal pain, nausea and vomiting.  All other systems reviewed and are negative.    Physical Exam Updated Vital Signs BP 118/85 (BP Location: Right Arm)   Pulse 68   Temp 98.3 F (36.8 C) (Oral)   Resp 18   Ht 5\' 1"  (1.549 m)   Wt 135 lb (61.2 kg)   LMP 07/12/2016   SpO2 100%   BMI 25.51 kg/m   Physical Exam  Constitutional: She is oriented to person, place, and time. She appears well-developed and well-nourished.  HENT:  Head: Normocephalic and atraumatic.  Right Ear: External ear normal.  Left Ear: External ear normal.  Nose: Nose normal.  Mouth/Throat: Oropharynx is clear and moist.  Eyes: Conjunctivae and EOM are normal. Pupils are equal, round, and reactive to light.  Neck: Normal range of motion. Neck supple.  Cardiovascular:  Normal rate, regular rhythm, normal heart sounds and intact distal pulses.   Pulmonary/Chest: Effort normal and breath sounds normal.  Abdominal: Soft. There is tenderness in the suprapubic area.  Musculoskeletal: Normal range of motion.  Neurological: She is alert and oriented to person, place, and time.  Skin: Skin is warm and dry.  Psychiatric: She has a normal mood and affect. Her behavior is normal. Judgment and thought content normal.  Nursing note and vitals reviewed.    ED Treatments / Results  Labs (all labs ordered are listed, but only abnormal results are displayed) Labs Reviewed  URINALYSIS, ROUTINE W REFLEX MICROSCOPIC (NOT AT [facility]) - Abnormal; Notable for the following:       Result Value   Color, Urine AMBER  (*)    APPearance TURBID (*)    Bilirubin Urine SMALL (*)    Ketones, ur >80 (*)    Protein, ur 30 (*)    Leukocytes, UA MODERATE (*)    All other components within normal limits  URINE MICROSCOPIC-ADD ON - Abnormal; Notable for the following:    Squamous Epithelial / LPF 6-30 (*)    Bacteria, UA MANY (*)    All other components within normal limits  COMPREHENSIVE METABOLIC PANEL - Abnormal; Notable for the following:    Potassium 3.0 (*)    ALT 12 (*)    Total Bilirubin 1.6 (*)    All other components within normal limits  URINE CULTURE  PREGNANCY, URINE  CBC WITH DIFFERENTIAL/PLATELET    EKG  EKG Interpretation None       Radiology Ct Renal Stone Study  Result Date: 08/02/2016 CLINICAL DATA:  Lower abdominal pain with nausea, vomiting, and diarrhea for 3 days EXAM: CT ABDOMEN AND PELVIS WITHOUT CONTRAST TECHNIQUE: Multidetector CT imaging of the abdomen and pelvis was performed following the standard protocol without IV contrast. COMPARISON:  None. FINDINGS: Lower chest and abdominal wall:  No contributory findings. Hepatobiliary: No focal liver abnormality.Cholelithiasis without signs of cholecystitis. Pancreas: Unremarkable. Spleen: Unremarkable. Adrenals/Urinary Tract: Negative adrenals. Punctate left lower pole renal calculus. No hydronephrosis. There are multiple pelvic phleboliths; no suspected ureteral calculus Unremarkable bladder. Stomach/Bowel: No obstruction. Appendix not clearly seen. No evidence of appendicitis. Reproductive:Normal. Vascular/Lymphatic: No acute vascular abnormality. No mass or adenopathy. Other: No ascites or pneumoperitoneum. Musculoskeletal: Negative IMPRESSION: 1. No hydronephrosis or other acute finding. 2. Punctate left nephrolithiasis. 3. Cholelithiasis. Electronically Signed   By: Marnee SpringJonathon  Watts M.D.   On: 08/02/2016 21:45    Procedures Procedures (including critical care time)  Medications Ordered in ED Medications  sodium chloride 0.9 %  bolus 1,000 mL (1,000 mLs Intravenous New Bag/Given 08/02/16 2212)  morphine 4 MG/ML injection 4 mg (4 mg Intravenous Given 08/02/16 2212)  ondansetron (ZOFRAN) injection 4 mg (4 mg Intravenous Given 08/02/16 2212)  cefTRIAXone (ROCEPHIN) 1 g in dextrose 5 % 50 mL IVPB (1 g Intravenous New Bag/Given 08/02/16 2213)     Initial Impression / Assessment and Plan / ED Course  I have reviewed the triage vital signs and the nursing notes.  Pertinent labs & imaging results that were available during my care of the patient were reviewed by me and considered in my medical decision making (see chart for details).  Clinical Course    Pt is feeling much better.  She will be treated for the uti.  She is told of the gallstones, but she has no pain there.  She knows to return if worse.  Final Clinical Impressions(s) / ED Diagnoses  Final diagnoses:  UTI (lower urinary tract infection)  Calculus of gallbladder without cholecystitis without obstruction    New Prescriptions New Prescriptions   HYDROCODONE-ACETAMINOPHEN (NORCO/VICODIN) 5-325 MG TABLET    Take 1 tablet by mouth every 4 (four) hours as needed.   ONDANSETRON (ZOFRAN) 4 MG TABLET    Take 1 tablet (4 mg total) by mouth every 6 (six) hours.   SULFAMETHOXAZOLE-TRIMETHOPRIM (BACTRIM DS,SEPTRA DS) 800-160 MG TABLET    Take 1 tablet by mouth 2 (two) times daily.     Jacalyn Lefevre, MD 08/02/16 2256

## 2016-08-04 LAB — URINE CULTURE: Culture: NO GROWTH

## 2017-01-28 IMAGING — CT CT RENAL STONE PROTOCOL
2 of 4 series · 17 of 46 positions shown, 19 images · non-contrast
Comparison: None.

CLINICAL DATA: Lower abdominal pain with nausea, vomiting, and
diarrhea for 3 days

EXAM:
CT ABDOMEN AND PELVIS WITHOUT CONTRAST
TECHNIQUE: Multidetector CT imaging of the abdomen and pelvis was performed
following the standard protocol without IV contrast.

[Series 2: axial st · axial · 0.92mm/px · z∈[-426,-71]mm · 14 of 79 slices shown, 16 images]
[im 4/79  soft-tissue]
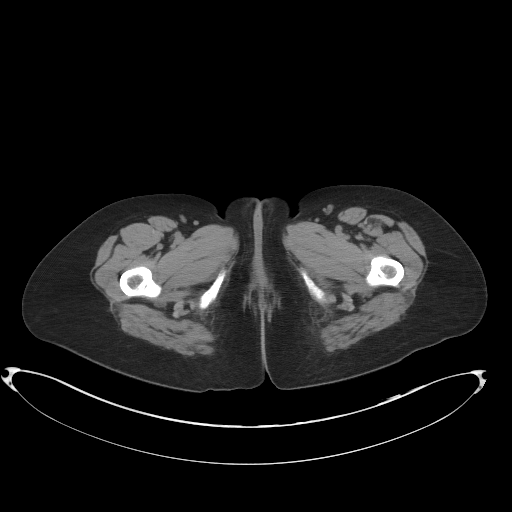
[im 4/79  bone]
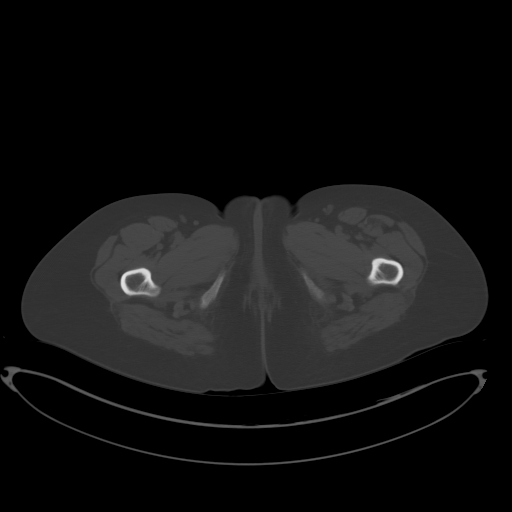
[im 10/79  soft-tissue]
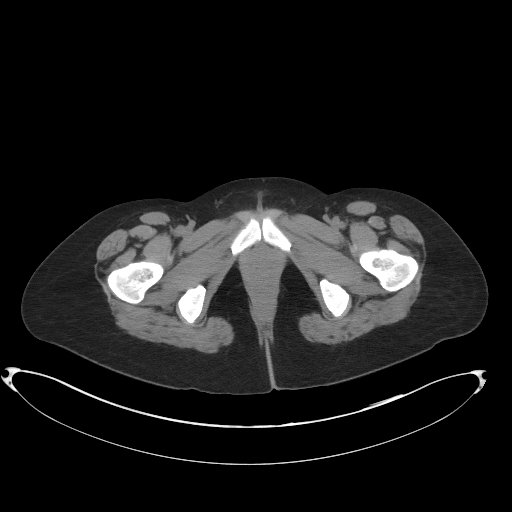
[im 17/79  soft-tissue]
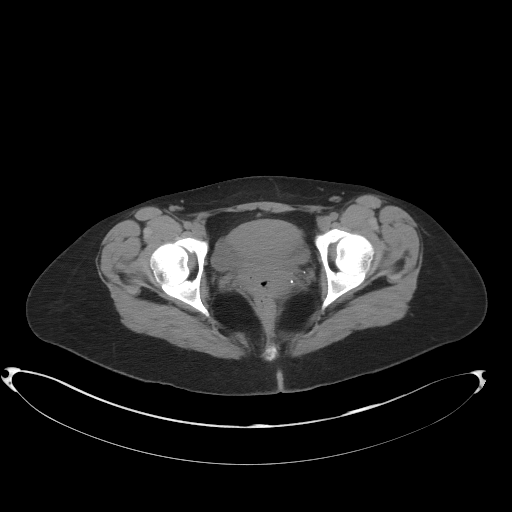
[im 20/79  soft-tissue]
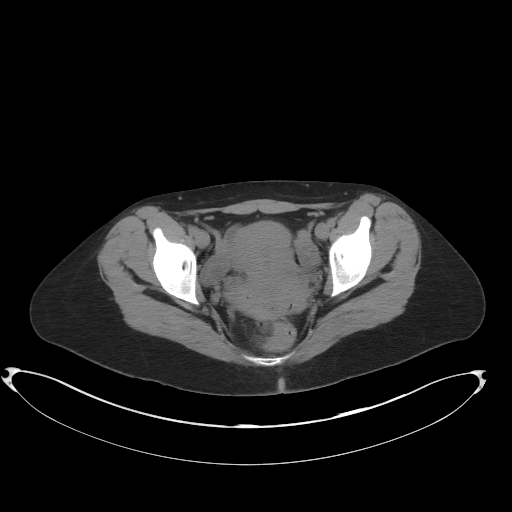
[im 27/79  soft-tissue]
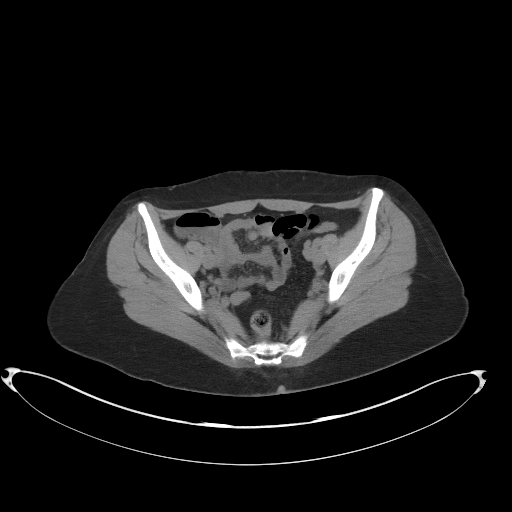
[im 33/79  soft-tissue]
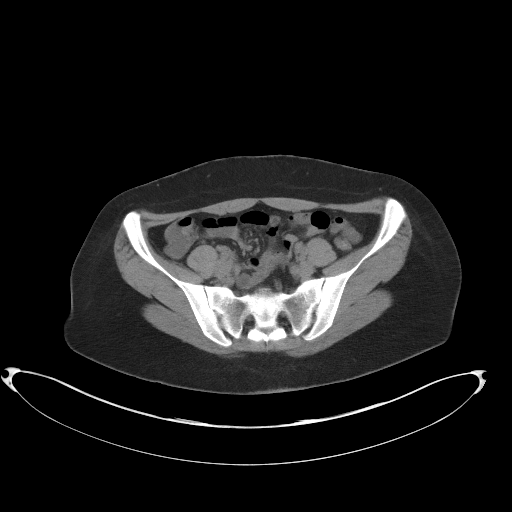
[im 36/79  soft-tissue]
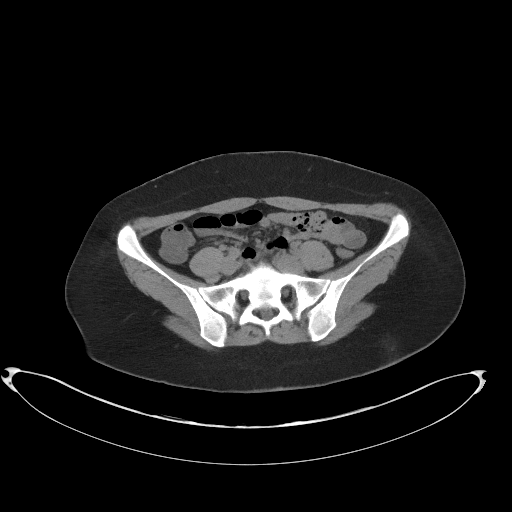
[im 43/79  soft-tissue]
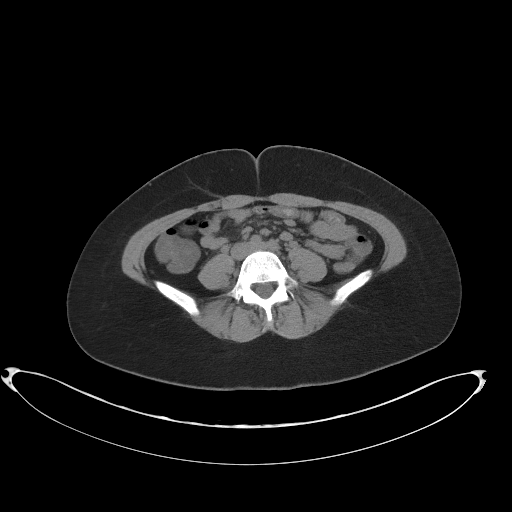
[im 46/79  soft-tissue]
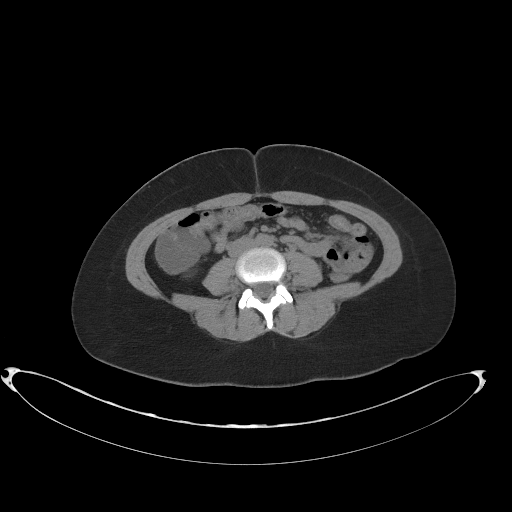
[im 46/79  bone]
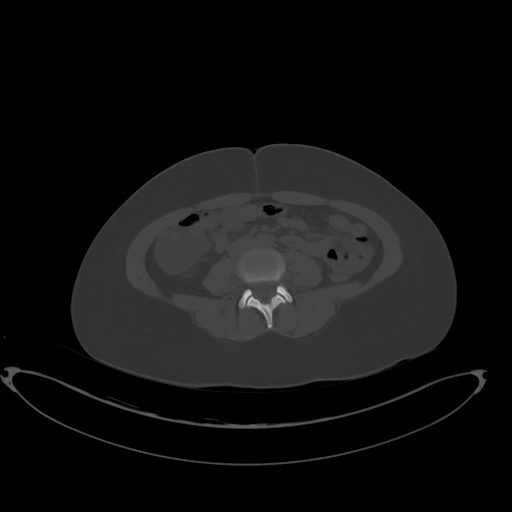
[im 53/79  soft-tissue]
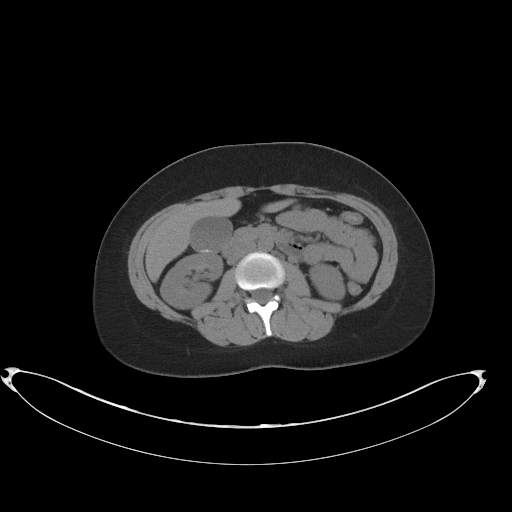
[im 59/79  soft-tissue]
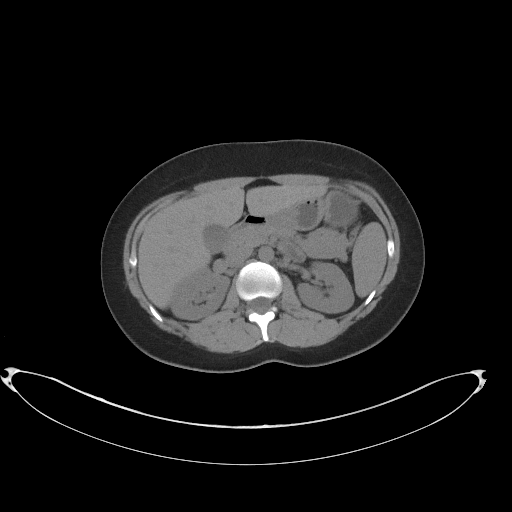
[im 62/79  soft-tissue]
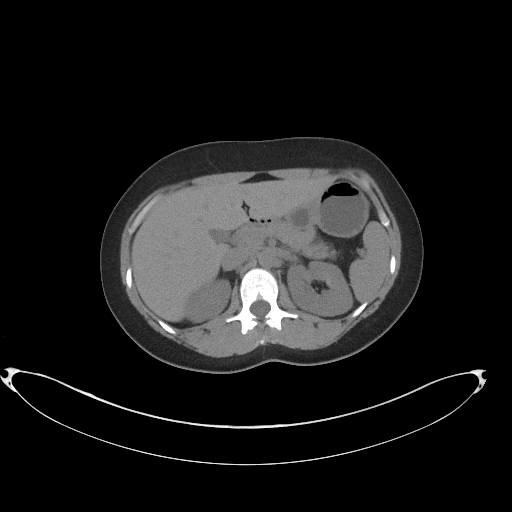
[im 69/79  soft-tissue]
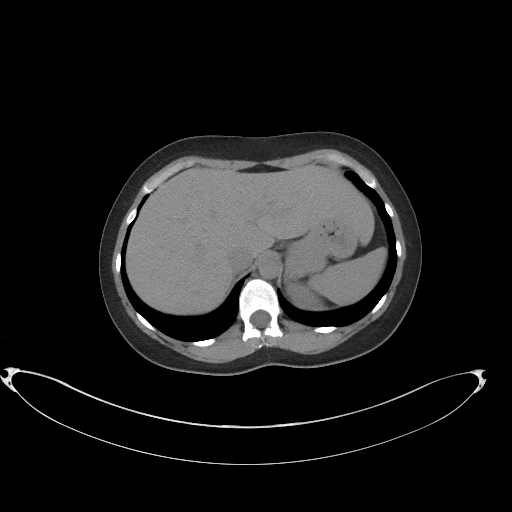
[im 75/79  soft-tissue]
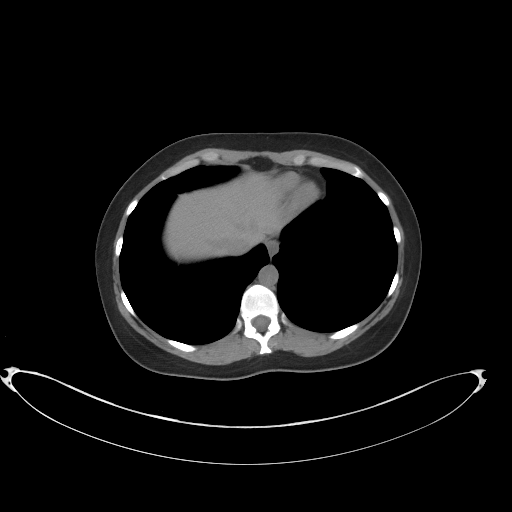

[Series 5: coronal st · coronal · 0.71mm/px · 3 of 78 slices shown]
[im 26/78  soft-tissue]
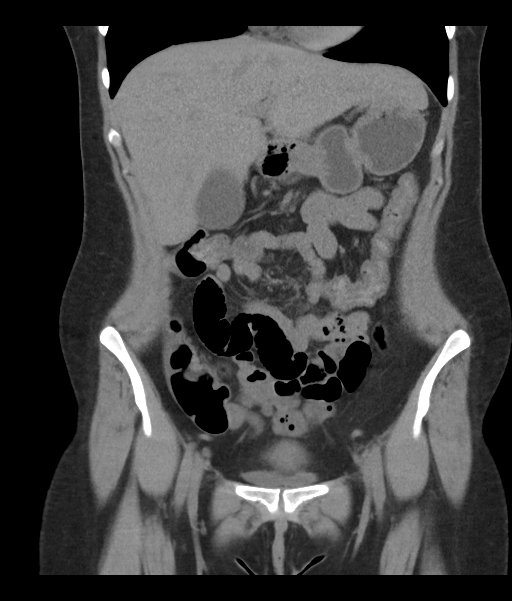
[im 35/78  soft-tissue]
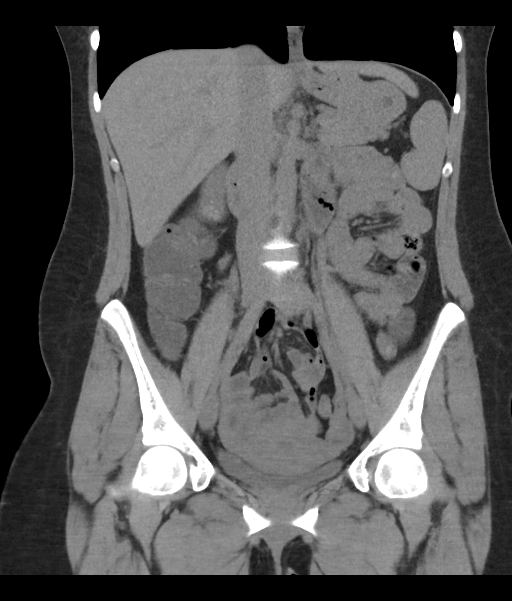
[im 43/78  soft-tissue]
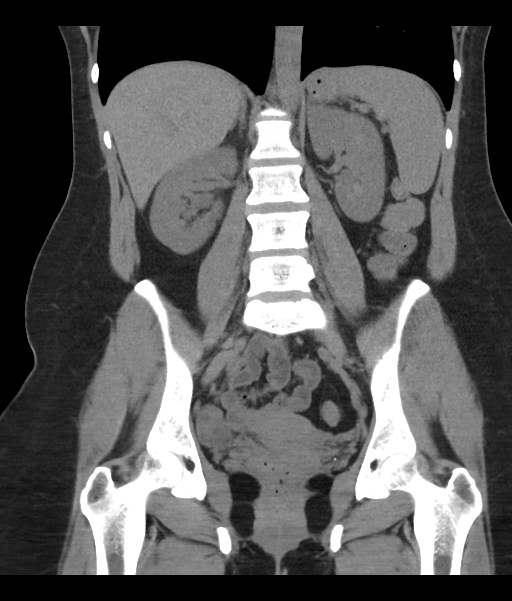

[17 of 46 positions shown; findings below may reference images not displayed]

FINDINGS: Lower chest and abdominal wall:  No contributory findings.

Hepatobiliary: No focal liver abnormality.Cholelithiasis without
signs of cholecystitis.

Pancreas: Unremarkable.

Spleen: Unremarkable.

Adrenals/Urinary Tract: Negative adrenals. Punctate left lower pole
renal calculus. No hydronephrosis. There are multiple pelvic
phleboliths; no suspected ureteral calculus Unremarkable bladder.

Stomach/Bowel: No obstruction. Appendix not clearly seen. No
evidence of appendicitis.

Reproductive:Normal.

Vascular/Lymphatic: No acute vascular abnormality. No mass or
adenopathy.

Other: No ascites or pneumoperitoneum.

Musculoskeletal: Negative
IMPRESSION: 1. No hydronephrosis or other acute finding.
2. Punctate left nephrolithiasis.
3. Cholelithiasis.

## 2018-02-25 ENCOUNTER — Ambulatory Visit: Payer: BLUE CROSS/BLUE SHIELD | Admitting: Clinical

## 2018-02-25 DIAGNOSIS — F319 Bipolar disorder, unspecified: Secondary | ICD-10-CM | POA: Diagnosis not present

## 2018-03-12 ENCOUNTER — Ambulatory Visit: Payer: BLUE CROSS/BLUE SHIELD | Admitting: Clinical

## 2018-03-12 DIAGNOSIS — F319 Bipolar disorder, unspecified: Secondary | ICD-10-CM | POA: Diagnosis not present

## 2018-04-11 ENCOUNTER — Ambulatory Visit: Payer: BLUE CROSS/BLUE SHIELD | Admitting: Clinical

## 2018-04-11 DIAGNOSIS — F319 Bipolar disorder, unspecified: Secondary | ICD-10-CM | POA: Diagnosis not present

## 2018-05-02 ENCOUNTER — Ambulatory Visit: Payer: BLUE CROSS/BLUE SHIELD | Admitting: Clinical

## 2018-05-02 DIAGNOSIS — F319 Bipolar disorder, unspecified: Secondary | ICD-10-CM | POA: Diagnosis not present

## 2018-05-21 ENCOUNTER — Ambulatory Visit: Payer: BLUE CROSS/BLUE SHIELD | Admitting: Clinical

## 2018-05-21 DIAGNOSIS — F319 Bipolar disorder, unspecified: Secondary | ICD-10-CM

## 2018-06-06 ENCOUNTER — Ambulatory Visit: Payer: BLUE CROSS/BLUE SHIELD | Admitting: Clinical

## 2018-06-06 DIAGNOSIS — F319 Bipolar disorder, unspecified: Secondary | ICD-10-CM

## 2018-07-04 ENCOUNTER — Ambulatory Visit: Payer: BLUE CROSS/BLUE SHIELD | Admitting: Clinical

## 2018-07-04 DIAGNOSIS — F319 Bipolar disorder, unspecified: Secondary | ICD-10-CM

## 2018-08-20 ENCOUNTER — Ambulatory Visit: Payer: BLUE CROSS/BLUE SHIELD | Admitting: Clinical

## 2020-06-11 ENCOUNTER — Other Ambulatory Visit: Payer: Self-pay

## 2020-06-11 ENCOUNTER — Emergency Department (HOSPITAL_BASED_OUTPATIENT_CLINIC_OR_DEPARTMENT_OTHER)
Admission: EM | Admit: 2020-06-11 | Discharge: 2020-06-11 | Disposition: A | Discharge status: Home / Self Care | Payer: Self-pay | Attending: Emergency Medicine | Admitting: Emergency Medicine

## 2020-06-11 ENCOUNTER — Encounter (HOSPITAL_BASED_OUTPATIENT_CLINIC_OR_DEPARTMENT_OTHER): Payer: Self-pay

## 2020-06-11 DIAGNOSIS — R0602 Shortness of breath: Secondary | ICD-10-CM | POA: Insufficient documentation

## 2020-06-11 DIAGNOSIS — R002 Palpitations: Secondary | ICD-10-CM | POA: Insufficient documentation

## 2020-06-11 DIAGNOSIS — F17210 Nicotine dependence, cigarettes, uncomplicated: Secondary | ICD-10-CM | POA: Insufficient documentation

## 2020-06-11 LAB — CBC WITH DIFFERENTIAL/PLATELET
Abs Immature Granulocytes: 0.04 10*3/uL (ref 0.00–0.07)
Basophils Absolute: 0.1 10*3/uL (ref 0.0–0.1)
Basophils Relative: 1 %
Eosinophils Absolute: 0.1 10*3/uL (ref 0.0–0.5)
Eosinophils Relative: 1 %
HCT: 43.2 % (ref 36.0–46.0)
Hemoglobin: 15.1 g/dL — ABNORMAL HIGH (ref 12.0–15.0)
Immature Granulocytes: 1 %
Lymphocytes Relative: 24 %
Lymphs Abs: 1.9 10*3/uL (ref 0.7–4.0)
MCH: 31.6 pg (ref 26.0–34.0)
MCHC: 35 g/dL (ref 30.0–36.0)
MCV: 90.4 fL (ref 80.0–100.0)
Monocytes Absolute: 0.4 10*3/uL (ref 0.1–1.0)
Monocytes Relative: 5 %
Neutro Abs: 5.5 10*3/uL (ref 1.7–7.7)
Neutrophils Relative %: 68 %
Platelets: 257 10*3/uL (ref 150–400)
RBC: 4.78 MIL/uL (ref 3.87–5.11)
RDW: 12.6 % (ref 11.5–15.5)
WBC: 7.9 10*3/uL (ref 4.0–10.5)
nRBC: 0 % (ref 0.0–0.2)

## 2020-06-11 LAB — PREGNANCY, URINE: Preg Test, Ur: NEGATIVE

## 2020-06-11 LAB — URINALYSIS, ROUTINE W REFLEX MICROSCOPIC
Bilirubin Urine: NEGATIVE
Glucose, UA: 100 mg/dL — AB
Hgb urine dipstick: NEGATIVE
Ketones, ur: NEGATIVE mg/dL
Nitrite: NEGATIVE
Protein, ur: NEGATIVE mg/dL
Specific Gravity, Urine: <1.005 — ABNORMAL LOW (ref 1.005–1.030)
pH: 6.5 (ref 5.0–8.0)

## 2020-06-11 LAB — BASIC METABOLIC PANEL
Anion gap: 10 (ref 5–15)
BUN: 11 mg/dL (ref 6–20)
CO2: 26 mmol/L (ref 22–32)
Calcium: 9.3 mg/dL (ref 8.9–10.3)
Chloride: 103 mmol/L (ref 98–111)
Creatinine, Ser: 0.45 mg/dL (ref 0.44–1.00)
GFR calc Af Amer: >60 mL/min (ref >60)
GFR calc non Af Amer: >60 mL/min (ref >60)
Glucose, Bld: 92 mg/dL (ref 70–99)
Potassium: 4.1 mmol/L (ref 3.5–5.1)
Sodium: 139 mmol/L (ref 135–145)

## 2020-06-11 LAB — URINALYSIS, MICROSCOPIC (REFLEX)

## 2020-06-11 LAB — CBG MONITORING, ED: Glucose-Capillary: 98 mg/dL (ref 70–99)

## 2020-06-11 MED ORDER — HYDROXYZINE HCL 25 MG PO TABS: 25.0000 mg | ORAL_TABLET | Freq: Four times a day (QID) | ORAL | 0 refills | Status: AC | PRN

## 2020-06-11 NOTE — Discharge Instructions (Addendum)
Your comprehensive work-up here today was entirely reassuring.  However, your report of episodic palpitations is concerning for intermittent arrhythmia (abnormal heart rhythm).  You would benefit from a Holter monitor or Zio patch to monitor for abnormal heart rhythms.  Please follow-up with [facility] or [facility] or get established with another primary care provider so that you may receive further evaluation of your palpitations.  Please take the hydroxyzine as needed for symptoms of anxiety or heart racing.    Return to the ED or seek immediate medical attention should you experience any new or worsening symptoms.

## 2020-06-11 NOTE — ED Provider Notes (Addendum)
[facility] EMERGENCY DEPARTMENT Provider Note   CSN: [id number] Arrival date & time: 06/11/20  1158     History Chief Complaint  Patient presents with  . Palpitations    Anne Rodriguez is a 33 y.o. female with past medical history notable for anxiety presents to the ED with 4-day history of intermittent episodes of palpitations, shortness of breath, and feeling as though she might pass out.  Patient reports that she recently came off of her menses and endorses a history of menorrhagia.  She is concerned because she has a history of anemia and suggest that that may be contributing to her symptoms.  She also states that she has not been eating and drinking well because she has some mild nausea when experiencing these episodes and is afraid that she might throw up.  She endorses a family history of premature cardiac disease as her father had his first heart attack at the age of 18.  She is denying any significant chest discomfort associated with her episodes of palpitations and is currently asymptomatic.  She denies actually passing out or losing consciousness, dizziness, headache, fevers or chills, abdominal pain, emesis, urinary symptoms, weakness or numbness, pleuritic symptoms, cough, or other symptoms.  She also denies any history of clots, clotting disorder, recent surgery or immobilization, smoking, or illicit drug use.  HPI     Past Medical History:  Diagnosis Date  . Anxiety   . Bipolar disorder (HCC)   . Depression   . Medical history non-contributory   . Postpartum care following vaginal delivery (7/30) 07/09/2014    Patient Active Problem List   Diagnosis Date Noted  . SVD (spontaneous vaginal delivery) 07/09/2014  . Postpartum care following vaginal delivery (7/30) 07/09/2014  . Pregnancy with third trimester bleeding, antepartum 07/08/2014  . Active labor at term 07/08/2014  . Antepartum placental abruption in third trimester 05/26/2014  . Pregnancy with third  trimester bleeding 04/22/2014  . Third trimester bleeding, marginal bleed /low lying placenta - 26 weeks 04/22/2014    Past Surgical History:  Procedure Laterality Date  . WISDOM TOOTH EXTRACTION       OB History    Gravida  1   Para  1   Term  1   Preterm  0   AB  0   Living  1     SAB  0   TAB  0   Ectopic  0   Multiple  0   Live Births  1           Family History  Problem Relation Age of Onset  . Cancer Mother   . Alcohol abuse Mother   . Heart disease Father   . Alcohol abuse Father   . Cerebral palsy Maternal Aunt   . Miscarriages / Stillbirths Maternal Grandmother     Social History   Tobacco Use  . Smoking status: Current Every Day Smoker    Packs/day: 0.50    Years: 6.00    Pack years: 3.00    Types: Cigarettes  . Smokeless tobacco: Never Used  Vaping Use  . Vaping Use: Never used  Substance Use Topics  . Alcohol use: Yes    Comment: occ  . Drug use: No    Home Medications Prior to Admission medications   Medication Sig Start Date End Date Taking? Authorizing Provider  HYDROcodone-acetaminophen (NORCO/VICODIN) 5-325 MG tablet Take 1 tablet by mouth every 4 (four) hours as needed. 08/02/16   Jacalyn Lefevre, MD  hydrOXYzine (ATARAX/VISTARIL) 25 MG tablet Take 1 tablet (25 mg total) by mouth every 6 (six) hours as needed for up to 20 doses for anxiety. 06/11/20   Lorelee NewGreen, Brenin Heidelberger L, PA-C  ondansetron (ZOFRAN) 4 MG tablet Take 1 tablet (4 mg total) by mouth every 6 (six) hours. 08/02/16   Jacalyn LefevreHaviland, Julie, MD    Allergies    Latex  Review of Systems   Review of Systems  Constitutional: Negative for fever.  Respiratory: Positive for shortness of breath.   Cardiovascular: Positive for palpitations. Negative for chest pain and leg swelling.  Neurological: Positive for light-headedness. Negative for syncope.    Physical Exam Updated Vital Signs BP 116/79   Pulse 87   Temp 98.5 F (36.9 C) (Oral)   Resp 13   Ht 5\' 1"  (1.549 m)   Wt  73.9 kg   LMP 05/31/2020   SpO2 98%   BMI 30.80 kg/m   Physical Exam Vitals and nursing note reviewed. Exam conducted with a chaperone present.  Constitutional:      General: She is not in acute distress.    Appearance: Normal appearance. She is not ill-appearing.  HENT:     Head: Normocephalic and atraumatic.  Eyes:     General: No scleral icterus.    Conjunctiva/sclera: Conjunctivae normal.  Cardiovascular:     Rate and Rhythm: Regular rhythm. Tachycardia present.     Pulses: Normal pulses.     Heart sounds: Normal heart sounds.  Pulmonary:     Effort: Pulmonary effort is normal. No respiratory distress.     Breath sounds: Normal breath sounds.  Abdominal:     General: Abdomen is flat. There is no distension.     Palpations: Abdomen is soft.     Tenderness: There is no abdominal tenderness.  Musculoskeletal:     Cervical back: Normal range of motion. No rigidity.     Right lower leg: No edema.     Left lower leg: No edema.  Skin:    General: Skin is dry.     Capillary Refill: Capillary refill takes less than 2 seconds.  Neurological:     General: No focal deficit present.     Mental Status: She is alert and oriented to person, place, and time.     GCS: GCS eye subscore is 4. GCS verbal subscore is 5. GCS motor subscore is 6.     Sensory: No sensory deficit.     Coordination: Coordination normal.     Gait: Gait normal.  Psychiatric:        Mood and Affect: Mood normal.        Behavior: Behavior normal.        Thought Content: Thought content normal.     ED Results / Procedures / Treatments   Labs (all labs ordered are listed, but only abnormal results are displayed) Labs Reviewed  URINALYSIS, ROUTINE W REFLEX MICROSCOPIC - Abnormal; Notable for the following components:      Result Value   Color, Urine STRAW (*)    Specific Gravity, Urine <1.005 (*)    Glucose, UA 100 (*)    Leukocytes,Ua SMALL (*)    All other components within normal limits  URINALYSIS,  MICROSCOPIC (REFLEX) - Abnormal; Notable for the following components:   Bacteria, UA MANY (*)    All other components within normal limits  CBC WITH DIFFERENTIAL/PLATELET - Abnormal; Notable for the following components:   Hemoglobin 15.1 (*)    All other components within normal  limits  PREGNANCY, URINE  BASIC METABOLIC PANEL  CBG MONITORING, ED    EKG EKG Interpretation  Date/Time:  Friday June 11 2020 14:09:59 EDT Ventricular Rate:  96 PR Interval:    QRS Duration: 73 QT Interval:  344 QTC Calculation: 435 R Axis:   75 Text Interpretation: Sinus rhythm Normal ECG Confirmed by Gwyneth Sprout ([id number]) on 06/11/2020 2:33:21 PM   Radiology No results found.  Procedures Procedures (including critical care time)  Medications Ordered in ED Medications - No data to display  ED Course  I have reviewed the triage vital signs and the nursing notes.  Pertinent labs & imaging results that were available during my care of the patient were reviewed by me and considered in my medical decision making (see chart for details).    MDM Rules/Calculators/A&P                          Patient's history is concerning for multiple episodes of presyncope versus possible arrhythmia.  She is no longer having symptoms here in the ED, so EKG unreliable.  She would likely benefit from Holter monitor or Zio patch for further evaluation.  While she is intermittently tachycardic here in the ED, low suspicion for infection or dehydration as cause of her tachycardia.  She is anxious appearing.  Patient is PERC negative.  She denies any chest pain.  She is not pregnant.  Labs: CBC: Unremarkable.  No anemia.  No leukocytosis concerning for infection. BMP: No electrolyte derangement.  Normal renal function. UA: No evidence of infection.  EKG: Normal sinus rhythm.  No concern for WPW or other arrhythmia.  Will prescribe patient Vistaril for to take as needed for symptoms of tenderness or palpitations  that may be attributed to her history of anxiety/bipolar disorder.  She states that she was on mood stabilizers before her insurance expired and she is working towards getting new insurance.  In the meantime, we will refer her to [facility] and [facility].  She may benefit from Holter monitor versus Zio patch.  Her neurologic exam was entirely benign.  Her history is not particularly concerning for a BPPV or other peripheral etiology for her dizziness.  She instead reports that she simply feels lightheaded and was more concerned for an anemia picture.  Her laboratory work-up is entirely reassuring.  Considered TSH, but lower suspicion at this time.  Patient is reassured by today's comprehensive work-up.  All of the evaluation and work-up results were discussed with the patient and any family at bedside.  Patient and/or family were informed that while patient is appropriate for discharge at this time, some medical emergencies may only develop or become detectable after a period of time.  I specifically instructed patient and/or family to return to return to the ED or seek immediate medical attention for any new or worsening symptoms.  They were provided opportunity to ask any additional questions and have none at this time.  Prior to discharge patient is feeling well, agreeable with plan for discharge home.  They have expressed understanding of verbal discharge instructions as well as return precautions and are agreeable to the plan.    Final Clinical Impression(s) / ED Diagnoses Final diagnoses:  Palpitations    Rx / DC Orders ED Discharge Orders         Ordered    hydrOXYzine (ATARAX/VISTARIL) 25 MG tablet  Every 6 hours PRN     Discontinue  Reprint  06/11/20 1503           Lorelee New, PA-C 06/11/20 1505    Lorelee New, PA-C 06/11/20 1507    Gwyneth Sprout, MD 06/22/20 1609

## 2020-06-11 NOTE — ED Notes (Signed)
Advised EKG done at 1214pm in triage did not cross over into pt's chart-printed copy was given to EDP Plunkett at time of EKG-2nd EKG was done

## 2020-06-11 NOTE — ED Triage Notes (Addendum)
Pt c/o palpitations, light headed, intermittent CP x 4 days "feeling shaky"-denies pain at present-NAD steady gait
